# Patient Record
Sex: Male | Born: 1980 | Race: White | Hispanic: No | Marital: Married | Smoking: Current every day smoker
Health system: Southern US, Community
[De-identification: ages and names within clinical notes are randomized; demographics above are authoritative.]

## PROBLEM LIST (undated history)

## (undated) DIAGNOSIS — G4730 Sleep apnea, unspecified: Secondary | ICD-10-CM

## (undated) DIAGNOSIS — G709 Myoneural disorder, unspecified: Secondary | ICD-10-CM

## (undated) DIAGNOSIS — K219 Gastro-esophageal reflux disease without esophagitis: Secondary | ICD-10-CM

## (undated) DIAGNOSIS — M1990 Unspecified osteoarthritis, unspecified site: Secondary | ICD-10-CM

## (undated) DIAGNOSIS — F419 Anxiety disorder, unspecified: Secondary | ICD-10-CM

## (undated) DIAGNOSIS — F32A Depression, unspecified: Secondary | ICD-10-CM

## (undated) DIAGNOSIS — R51 Headache: Secondary | ICD-10-CM

## (undated) DIAGNOSIS — I10 Essential (primary) hypertension: Secondary | ICD-10-CM

---

## 2004-08-18 HISTORY — PX: KNEE ARTHROSCOPY: SUR90

## 2004-08-18 HISTORY — PX: KNEE SURGERY: SHX244

## 2004-11-08 ENCOUNTER — Ambulatory Visit (HOSPITAL_COMMUNITY): Admission: RE | Admit: 2004-11-08 | Discharge: 2004-11-08 | Payer: Self-pay | Admitting: Orthopedic Surgery

## 2004-11-22 ENCOUNTER — Ambulatory Visit (HOSPITAL_COMMUNITY): Admission: AD | Admit: 2004-11-22 | Discharge: 2004-11-23 | Payer: Self-pay | Admitting: Orthopedic Surgery

## 2011-04-14 ENCOUNTER — Ambulatory Visit (HOSPITAL_BASED_OUTPATIENT_CLINIC_OR_DEPARTMENT_OTHER)
Admission: RE | Admit: 2011-04-14 | Discharge: 2011-04-14 | Disposition: A | Discharge status: Home / Self Care | Payer: 59 | Source: Ambulatory Visit | Attending: Orthopedic Surgery | Admitting: Orthopedic Surgery

## 2011-04-14 DIAGNOSIS — Z01810 Encounter for preprocedural cardiovascular examination: Secondary | ICD-10-CM | POA: Insufficient documentation

## 2011-04-14 DIAGNOSIS — Z01812 Encounter for preprocedural laboratory examination: Secondary | ICD-10-CM | POA: Insufficient documentation

## 2011-04-14 DIAGNOSIS — X58XXXA Exposure to other specified factors, initial encounter: Secondary | ICD-10-CM | POA: Insufficient documentation

## 2011-04-14 DIAGNOSIS — Y929 Unspecified place or not applicable: Secondary | ICD-10-CM | POA: Insufficient documentation

## 2011-04-14 DIAGNOSIS — S60459A Superficial foreign body of unspecified finger, initial encounter: Secondary | ICD-10-CM | POA: Insufficient documentation

## 2011-04-14 LAB — POCT I-STAT, CHEM 8
BUN: 14 mg/dL (ref 6–23)
Calcium, Ion: 1.15 mmol/L (ref 1.12–1.32)
Chloride: 104 mEq/L (ref 96–112)
Creatinine, Ser: 0.9 mg/dL (ref 0.50–1.35)
Glucose, Bld: 100 mg/dL — ABNORMAL HIGH (ref 70–99)
HCT: 45 % (ref 39.0–52.0)
Hemoglobin: 15.3 g/dL (ref 13.0–17.0)
Potassium: 4.2 mEq/L (ref 3.5–5.1)
Sodium: 139 mEq/L (ref 135–145)
TCO2: 25 mmol/L (ref 0–100)

## 2011-04-16 LAB — WOUND CULTURE

## 2011-04-19 LAB — ANAEROBIC CULTURE

## 2011-04-30 NOTE — Op Note (Signed)
  NAMEDERMOT, GREMILLION               ACCOUNT NO.:  [id number]  MEDICAL RECORD NO.:  [id number]  LOCATION:                                 FACILITY:  PHYSICIAN:  Artist Pais. Mina Marble, M.D.   DATE OF BIRTH:  DATE OF PROCEDURE:  04/14/2011 DATE OF DISCHARGE:                              OPERATIVE REPORT   PREOPERATIVE DIAGNOSIS:  Foreign body deep left index finger volar aspect.  POSTOPERATIVE DIAGNOSIS:  Foreign body deep left index finger volar aspect.  PROCEDURE:  I and D above with excision of foreign body deep (wooden splinter).  SURGEON:  Artist Pais. Mina Marble, MD  ASSISTANT:  None.  ANESTHESIA:  General.  TOURNIQUET TIME:  8 minutes.  COMPLICATIONS:  None.  DRAIN:  None.  CULTURES:  X2 sent.  DESCRIPTION OF PROCEDURE:  The patient was taken to the operating suite after induction of adequate general anesthesia, left upper extremity was prepped and draped in sterile fashion.  An Esmarch was used to exsanguinate the limb.  Tourniquet inflated to 250 mmHg.  At this point, a transverse incision was made over the proximal phalanx to the PIP and MP flexion creases volarly along the ulnar side about a centimeter in length.  Skin was incised.  Some light material was cultured. Dissection was carried down toward the flexor sheath for a 3 mm x 9 mm wooden splinter was removed without difficulty.  Wound was copiously irrigated with 500 mL of normal saline.  Loosely closed with a single 4- 0 nylon horizontal mattress suture.  Xeroform, 4x4s and a Coban wrap was applied.  The patient tolerated the procedure well, to recovery room in stable fashion.     Artist Pais Mina Marble, M.D.    MAW/MEDQ  D:  04/14/2011  T:  04/14/2011  Job:  [id number]  Electronically Signed by Dairl Ponder M.D. on 04/30/2011 08:45:27 AM

## 2012-05-18 ENCOUNTER — Other Ambulatory Visit: Payer: Self-pay | Admitting: Neurosurgery

## 2012-05-20 ENCOUNTER — Encounter (HOSPITAL_COMMUNITY): Payer: Self-pay | Admitting: Pharmacy Technician

## 2012-05-24 NOTE — Pre-Procedure Instructions (Signed)
[address]ING  05/24/2012   Your procedure is scheduled on:  Monday, October 14th  Report to [facility] at 1045 AM.  Call this number if you have problems the morning of surgery: [phone]   Remember:   Do not eat food or drink:After Midnight.  Take these medicines the morning of surgery with A SIP OF WATER: prilosec, percocet if needed   Do not wear jewelry, make-up or nail polish.  Do not wear lotions, powders, or perfumes.   Do not shave 48 hours prior to surgery. Men may shave face and neck.  Do not bring valuables to the hospital.  Contacts, dentures or bridgework may not be worn into surgery.  Leave suitcase in the car. After surgery it may be brought to your room.  For patients admitted to the hospital, checkout time is 11:00 AM the day of discharge.    Special Instructions: Shower using CHG 2 nights before surgery and the night before surgery.  If you shower the day of surgery use CHG.  Use special wash - you have one bottle of CHG for all showers.  You should use approximately 1/3 of the bottle for each shower.   Please read over the following fact sheets that you were given: Pain Booklet, Coughing and Deep Breathing, MRSA Information and Surgical Site Infection Prevention

## 2012-05-25 ENCOUNTER — Encounter (HOSPITAL_COMMUNITY)
Admission: RE | Admit: 2012-05-25 | Discharge: 2012-05-25 | Disposition: A | Discharge status: Home / Self Care | Payer: Managed Care, Other (non HMO) | Source: Ambulatory Visit | Attending: Neurosurgery | Admitting: Neurosurgery

## 2012-05-25 ENCOUNTER — Encounter (HOSPITAL_COMMUNITY): Payer: Self-pay

## 2012-05-25 HISTORY — DX: Essential (primary) hypertension: I10

## 2012-05-25 HISTORY — DX: Headache: R51

## 2012-05-25 HISTORY — DX: Unspecified osteoarthritis, unspecified site: M19.90

## 2012-05-25 HISTORY — DX: Gastro-esophageal reflux disease without esophagitis: K21.9

## 2012-05-25 LAB — BASIC METABOLIC PANEL
BUN: 13 mg/dL (ref 6–23)
Calcium: 10 mg/dL (ref 8.4–10.5)
GFR calc Af Amer: >90 mL/min (ref >90)
GFR calc non Af Amer: >90 mL/min (ref >90)
Potassium: 4.3 mEq/L (ref 3.5–5.1)
Sodium: 139 mEq/L (ref 135–145)

## 2012-05-25 LAB — CBC
Hemoglobin: 15.4 g/dL (ref 13.0–17.0)
MCHC: 34.4 g/dL (ref 30.0–36.0)
Platelets: 197 10*3/uL (ref 150–400)
RBC: 5.14 MIL/uL (ref 4.22–5.81)

## 2012-05-25 LAB — SURGICAL PCR SCREEN: MRSA, PCR: POSITIVE — AB

## 2012-05-25 NOTE — Progress Notes (Signed)
Primary Physician - Dr. Nevada Crane; [place] No type of cardiac work-up

## 2012-05-26 NOTE — Consult Note (Signed)
Anesthesia chart review: Patient is a 31 year old male scheduled for C3-4, C4-5 ACDF on 05/31/2012 by Dr. Lovell Sheehan. History includes obesity, nonsmoker, hypertension, headaches, GERD, arthritis, prior knee surgery. PCP is Dr. Jeanie Sewer at [facility] in [place].  EKG on 05/25/12 showed NSR.    Labs noted.    CXR on 05/25/12 showed 1. No radiographic evidence of acute cardiopulmonary disease. 2. 6 mm dense nodular opacity projecting over the left apex noted only on the frontal projection. This is strongly favored to represent either a small calcified granuloma or a bone island in this young patient. If of clinical concern, this could be followed with repeat chest radiograph in 6 months to ensure stability.  Darl Pikes at Dr. York Ram office is aware of CXR results and will review with Dr. Lovell Sheehan.  Defer plans for follow-up to the surgeon.  Shonna Chock, PA-C

## 2012-05-30 MED ORDER — CEFAZOLIN SODIUM-DEXTROSE 2-3 GM-% IV SOLR
2.0000 g | INTRAVENOUS | Status: AC
  Administered 2012-05-31: 2 g via INTRAVENOUS

## 2012-05-31 ENCOUNTER — Encounter (HOSPITAL_COMMUNITY)
Admission: RE | Disposition: A | Discharge status: Home / Self Care | Payer: Self-pay | Source: Ambulatory Visit | Attending: Neurosurgery

## 2012-05-31 ENCOUNTER — Observation Stay (HOSPITAL_COMMUNITY)
Admission: RE | Admit: 2012-05-31 | Discharge: 2012-06-01 | Disposition: A | Discharge status: Home / Self Care | Payer: Managed Care, Other (non HMO) | Source: Ambulatory Visit | Attending: Neurosurgery | Admitting: Neurosurgery

## 2012-05-31 ENCOUNTER — Encounter (HOSPITAL_COMMUNITY): Payer: Self-pay | Admitting: Vascular Surgery

## 2012-05-31 ENCOUNTER — Encounter (HOSPITAL_COMMUNITY): Payer: Self-pay | Admitting: *Deleted

## 2012-05-31 ENCOUNTER — Ambulatory Visit (HOSPITAL_COMMUNITY): Payer: Managed Care, Other (non HMO)

## 2012-05-31 ENCOUNTER — Ambulatory Visit (HOSPITAL_COMMUNITY): Payer: Managed Care, Other (non HMO) | Admitting: Vascular Surgery

## 2012-05-31 DIAGNOSIS — Z01810 Encounter for preprocedural cardiovascular examination: Secondary | ICD-10-CM | POA: Insufficient documentation

## 2012-05-31 DIAGNOSIS — M5020 Other cervical disc displacement, unspecified cervical region: Secondary | ICD-10-CM

## 2012-05-31 DIAGNOSIS — Z01818 Encounter for other preprocedural examination: Secondary | ICD-10-CM | POA: Insufficient documentation

## 2012-05-31 DIAGNOSIS — M5000 Cervical disc disorder with myelopathy, unspecified cervical region: Principal | ICD-10-CM | POA: Insufficient documentation

## 2012-05-31 DIAGNOSIS — K219 Gastro-esophageal reflux disease without esophagitis: Secondary | ICD-10-CM | POA: Insufficient documentation

## 2012-05-31 DIAGNOSIS — M4712 Other spondylosis with myelopathy, cervical region: Secondary | ICD-10-CM | POA: Insufficient documentation

## 2012-05-31 DIAGNOSIS — I10 Essential (primary) hypertension: Secondary | ICD-10-CM | POA: Insufficient documentation

## 2012-05-31 DIAGNOSIS — Z01812 Encounter for preprocedural laboratory examination: Secondary | ICD-10-CM | POA: Insufficient documentation

## 2012-05-31 HISTORY — PX: ANTERIOR CERVICAL DECOMP/DISCECTOMY FUSION: SHX1161

## 2012-05-31 SURGERY — ANTERIOR CERVICAL DECOMPRESSION/DISCECTOMY FUSION 2 LEVELS
Anesthesia: General | Wound class: Clean

## 2012-05-31 MED ORDER — BUPIVACAINE-EPINEPHRINE PF 0.5-1:200000 % IJ SOLN
INTRAMUSCULAR | Status: DC | PRN
  Administered 2012-05-31: 10 mL

## 2012-05-31 MED ORDER — CEFAZOLIN SODIUM-DEXTROSE 2-3 GM-% IV SOLR
INTRAVENOUS | Status: AC
  Filled 2012-05-31: qty 100

## 2012-05-31 MED ORDER — OXYCODONE HCL 5 MG PO TABS: 5.0000 mg | ORAL_TABLET | Freq: Once | ORAL | Status: DC | PRN

## 2012-05-31 MED ORDER — PANTOPRAZOLE SODIUM 40 MG PO TBEC: 40.0000 mg | DELAYED_RELEASE_TABLET | Freq: Every day | ORAL | Status: DC

## 2012-05-31 MED ORDER — HYDROMORPHONE HCL PF 1 MG/ML IJ SOLN
INTRAMUSCULAR | Status: AC
  Administered 2012-05-31: 0.5 mg via INTRAVENOUS
  Filled 2012-05-31: qty 1

## 2012-05-31 MED ORDER — FENTANYL CITRATE 0.05 MG/ML IJ SOLN
INTRAMUSCULAR | Status: DC | PRN
  Administered 2012-05-31 (×5): 50 ug via INTRAVENOUS
  Administered 2012-05-31: 100 ug via INTRAVENOUS
  Administered 2012-05-31 (×3): 50 ug via INTRAVENOUS

## 2012-05-31 MED ORDER — MENTHOL 3 MG MT LOZG: 1.0000 | LOZENGE | OROMUCOSAL | Status: DC | PRN

## 2012-05-31 MED ORDER — OXYCODONE HCL 5 MG/5ML PO SOLN: 5.0000 mg | Freq: Once | ORAL | Status: DC | PRN

## 2012-05-31 MED ORDER — DEXAMETHASONE 4 MG PO TABS: 4.0000 mg | ORAL_TABLET | Freq: Four times a day (QID) | ORAL | Status: DC

## 2012-05-31 MED ORDER — CEFAZOLIN SODIUM-DEXTROSE 2-3 GM-% IV SOLR
2.0000 g | Freq: Three times a day (TID) | INTRAVENOUS | Status: AC
  Administered 2012-05-31 – 2012-06-01 (×2): 2 g via INTRAVENOUS
  Filled 2012-05-31 (×2): qty 50

## 2012-05-31 MED ORDER — LACTATED RINGERS IV SOLN
INTRAVENOUS | Status: DC | PRN
  Administered 2012-05-31 (×2): via INTRAVENOUS

## 2012-05-31 MED ORDER — MIDAZOLAM HCL 5 MG/5ML IJ SOLN
INTRAMUSCULAR | Status: DC | PRN
  Administered 2012-05-31: 2 mg via INTRAVENOUS

## 2012-05-31 MED ORDER — ONDANSETRON HCL 4 MG/2ML IJ SOLN: 4.0000 mg | INTRAMUSCULAR | Status: DC | PRN

## 2012-05-31 MED ORDER — HYDROCODONE-ACETAMINOPHEN 5-325 MG PO TABS: 1.0000 | ORAL_TABLET | ORAL | Status: DC | PRN

## 2012-05-31 MED ORDER — MORPHINE SULFATE 2 MG/ML IJ SOLN
1.0000 mg | INTRAMUSCULAR | Status: DC | PRN
  Administered 2012-05-31 – 2012-06-01 (×2): 4 mg via INTRAVENOUS
  Filled 2012-05-31 (×2): qty 2

## 2012-05-31 MED ORDER — PHENOL 1.4 % MT LIQD: 1.0000 | OROMUCOSAL | Status: DC | PRN

## 2012-05-31 MED ORDER — ONDANSETRON HCL 4 MG/2ML IJ SOLN
INTRAMUSCULAR | Status: DC | PRN
  Administered 2012-05-31: 4 mg via INTRAVENOUS

## 2012-05-31 MED ORDER — OXYCODONE-ACETAMINOPHEN 5-325 MG PO TABS
1.0000 | ORAL_TABLET | ORAL | Status: DC | PRN
  Administered 2012-05-31 – 2012-06-01 (×2): 2 via ORAL
  Filled 2012-05-31 (×2): qty 2

## 2012-05-31 MED ORDER — ROCURONIUM BROMIDE 100 MG/10ML IV SOLN
INTRAVENOUS | Status: DC | PRN
  Administered 2012-05-31 (×3): 10 mg via INTRAVENOUS
  Administered 2012-05-31: 50 mg via INTRAVENOUS

## 2012-05-31 MED ORDER — DIAZEPAM 5 MG PO TABS
5.0000 mg | ORAL_TABLET | Freq: Four times a day (QID) | ORAL | Status: DC | PRN
  Administered 2012-05-31 – 2012-06-01 (×2): 5 mg via ORAL
  Filled 2012-05-31 (×2): qty 1

## 2012-05-31 MED ORDER — ZOLPIDEM TARTRATE 5 MG PO TABS: 5.0000 mg | ORAL_TABLET | Freq: Every evening | ORAL | Status: DC | PRN

## 2012-05-31 MED ORDER — NEOSTIGMINE METHYLSULFATE 1 MG/ML IJ SOLN
INTRAMUSCULAR | Status: DC | PRN
  Administered 2012-05-31: 4 mg via INTRAVENOUS

## 2012-05-31 MED ORDER — AMLODIPINE BESY-BENAZEPRIL HCL 10-20 MG PO CAPS: 1.0000 | ORAL_CAPSULE | Freq: Every day | ORAL | Status: DC

## 2012-05-31 MED ORDER — OXYCODONE-ACETAMINOPHEN 5-325 MG PO TABS
ORAL_TABLET | ORAL | Status: AC
  Filled 2012-05-31: qty 2

## 2012-05-31 MED ORDER — BENAZEPRIL HCL 20 MG PO TABS
20.0000 mg | ORAL_TABLET | Freq: Every day | ORAL | Status: DC
  Administered 2012-05-31: 20 mg via ORAL
  Filled 2012-05-31 (×2): qty 1

## 2012-05-31 MED ORDER — MEPERIDINE HCL 25 MG/ML IJ SOLN: 6.2500 mg | INTRAMUSCULAR | Status: DC | PRN

## 2012-05-31 MED ORDER — ACETAMINOPHEN 325 MG PO TABS: 650.0000 mg | ORAL_TABLET | ORAL | Status: DC | PRN

## 2012-05-31 MED ORDER — PROPOFOL 10 MG/ML IV BOLUS
INTRAVENOUS | Status: DC | PRN
  Administered 2012-05-31: 200 mg via INTRAVENOUS

## 2012-05-31 MED ORDER — HEMOSTATIC AGENTS (NO CHARGE) OPTIME
TOPICAL | Status: DC | PRN
  Administered 2012-05-31: 1 via TOPICAL

## 2012-05-31 MED ORDER — ONDANSETRON HCL 4 MG/2ML IJ SOLN: 4.0000 mg | Freq: Once | INTRAMUSCULAR | Status: DC | PRN

## 2012-05-31 MED ORDER — DEXAMETHASONE SODIUM PHOSPHATE 4 MG/ML IJ SOLN
4.0000 mg | Freq: Four times a day (QID) | INTRAMUSCULAR | Status: DC
  Administered 2012-05-31 – 2012-06-01 (×2): 4 mg via INTRAVENOUS
  Filled 2012-05-31 (×2): qty 1

## 2012-05-31 MED ORDER — AMLODIPINE BESYLATE 10 MG PO TABS
10.0000 mg | ORAL_TABLET | Freq: Every day | ORAL | Status: DC
  Administered 2012-05-31: 10 mg via ORAL
  Filled 2012-05-31 (×2): qty 1

## 2012-05-31 MED ORDER — BACITRACIN 50000 UNITS IM SOLR
INTRAMUSCULAR | Status: AC
  Filled 2012-05-31: qty 1

## 2012-05-31 MED ORDER — SODIUM CHLORIDE 0.9 % IR SOLN
Status: DC | PRN
  Administered 2012-05-31: 15:00:00

## 2012-05-31 MED ORDER — EPHEDRINE SULFATE 50 MG/ML IJ SOLN
INTRAMUSCULAR | Status: DC | PRN
  Administered 2012-05-31: 10 mg via INTRAVENOUS

## 2012-05-31 MED ORDER — HYDROMORPHONE HCL PF 1 MG/ML IJ SOLN
0.2500 mg | INTRAMUSCULAR | Status: DC | PRN
  Administered 2012-05-31 (×3): 0.5 mg via INTRAVENOUS

## 2012-05-31 MED ORDER — GLYCOPYRROLATE 0.2 MG/ML IJ SOLN
INTRAMUSCULAR | Status: DC | PRN
  Administered 2012-05-31: .6 mg via INTRAVENOUS

## 2012-05-31 MED ORDER — DOCUSATE SODIUM 100 MG PO CAPS
100.0000 mg | ORAL_CAPSULE | Freq: Two times a day (BID) | ORAL | Status: DC
  Administered 2012-05-31: 100 mg via ORAL
  Filled 2012-05-31: qty 1

## 2012-05-31 MED ORDER — LIDOCAINE HCL (CARDIAC) 20 MG/ML IV SOLN
INTRAVENOUS | Status: DC | PRN
  Administered 2012-05-31: 100 mg via INTRAVENOUS

## 2012-05-31 MED ORDER — SODIUM CHLORIDE 0.9 % IV SOLN
INTRAVENOUS | Status: AC
  Filled 2012-05-31: qty 500

## 2012-05-31 MED ORDER — ARTIFICIAL TEARS OP OINT
TOPICAL_OINTMENT | OPHTHALMIC | Status: DC | PRN
  Administered 2012-05-31: 1 via OPHTHALMIC

## 2012-05-31 MED ORDER — 0.9 % SODIUM CHLORIDE (POUR BTL) OPTIME
TOPICAL | Status: DC | PRN
  Administered 2012-05-31: 1000 mL

## 2012-05-31 MED ORDER — THROMBIN 5000 UNITS EX SOLR
CUTANEOUS | Status: DC | PRN
  Administered 2012-05-31 (×2): 5000 [IU] via TOPICAL

## 2012-05-31 MED ORDER — BACITRACIN ZINC 500 UNIT/GM EX OINT
TOPICAL_OINTMENT | CUTANEOUS | Status: DC | PRN
  Administered 2012-05-31: 1 via TOPICAL

## 2012-05-31 MED ORDER — LACTATED RINGERS IV SOLN
INTRAVENOUS | Status: DC
  Administered 2012-05-31: 23:00:00 via INTRAVENOUS

## 2012-05-31 MED ORDER — ACETAMINOPHEN 650 MG RE SUPP: 650.0000 mg | RECTAL | Status: DC | PRN

## 2012-05-31 SURGICAL SUPPLY — 59 items
APL SKNCLS STERI-STRIP NONHPOA (GAUZE/BANDAGES/DRESSINGS) ×1
BAG DECANTER FOR FLEXI CONT (MISCELLANEOUS) ×2 IMPLANT
BENZOIN TINCTURE PRP APPL 2/3 (GAUZE/BANDAGES/DRESSINGS) ×3 IMPLANT
BIT DRILL SPINE QC 14 (BIT) ×1 IMPLANT
BLADE SURG 15 STRL LF DISP TIS (BLADE) ×1 IMPLANT
BLADE SURG 15 STRL SS (BLADE) ×2
BLADE ULTRA TIP 2M (BLADE) ×2 IMPLANT
BRUSH SCRUB EZ PLAIN DRY (MISCELLANEOUS) ×2 IMPLANT
BUR BARREL STRAIGHT FLUTE 4.0 (BURR) ×2 IMPLANT
BUR MATCHSTICK NEURO 3.0 LAGG (BURR) ×2 IMPLANT
CANISTER SUCTION 2500CC (MISCELLANEOUS) ×2 IMPLANT
CLOTH BEACON ORANGE TIMEOUT ST (SAFETY) ×2 IMPLANT
CONT SPEC 4OZ CLIKSEAL STRL BL (MISCELLANEOUS) ×2 IMPLANT
COVER MAYO STAND STRL (DRAPES) ×2 IMPLANT
DRAPE LAPAROTOMY 100X72 PEDS (DRAPES) ×2 IMPLANT
DRAPE MICROSCOPE LEICA (MISCELLANEOUS) IMPLANT
DRAPE POUCH INSTRU U-SHP 10X18 (DRAPES) ×2 IMPLANT
DRAPE SURG 17X23 STRL (DRAPES) ×4 IMPLANT
ELECT REM PT RETURN 9FT ADLT (ELECTROSURGICAL) ×2
ELECTRODE REM PT RTRN 9FT ADLT (ELECTROSURGICAL) ×1 IMPLANT
GAUZE SPONGE 4X4 16PLY XRAY LF (GAUZE/BANDAGES/DRESSINGS) ×1 IMPLANT
GLOVE BIO SURGEON STRL SZ8.5 (GLOVE) ×2 IMPLANT
GLOVE ECLIPSE 7.5 STRL STRAW (GLOVE) ×3 IMPLANT
GLOVE ECLIPSE 8.5 STRL (GLOVE) ×1 IMPLANT
GLOVE EXAM NITRILE LRG STRL (GLOVE) IMPLANT
GLOVE EXAM NITRILE MD LF STRL (GLOVE) ×1 IMPLANT
GLOVE EXAM NITRILE XL STR (GLOVE) IMPLANT
GLOVE EXAM NITRILE XS STR PU (GLOVE) IMPLANT
GLOVE INDICATOR 8.0 STRL GRN (GLOVE) ×1 IMPLANT
GLOVE SS BIOGEL STRL SZ 8 (GLOVE) ×1 IMPLANT
GLOVE SUPERSENSE BIOGEL SZ 8 (GLOVE) ×1
GOWN BRE IMP SLV AUR LG STRL (GOWN DISPOSABLE) IMPLANT
GOWN BRE IMP SLV AUR XL STRL (GOWN DISPOSABLE) ×2 IMPLANT
KIT BASIN OR (CUSTOM PROCEDURE TRAY) ×2 IMPLANT
KIT ROOM TURNOVER OR (KITS) ×2 IMPLANT
MARKER SKIN DUAL TIP RULER LAB (MISCELLANEOUS) ×2 IMPLANT
NDL SPNL 18GX3.5 QUINCKE PK (NEEDLE) ×1 IMPLANT
NEEDLE HYPO 22GX1.5 SAFETY (NEEDLE) ×2 IMPLANT
NEEDLE SPNL 18GX3.5 QUINCKE PK (NEEDLE) ×2 IMPLANT
NS IRRIG 1000ML POUR BTL (IV SOLUTION) ×2 IMPLANT
PACK LAMINECTOMY NEURO (CUSTOM PROCEDURE TRAY) ×2 IMPLANT
PEEK VISTA 14X14X7MM (Peek) ×2 IMPLANT
PIN DISTRACTION 14MM (PIN) ×4 IMPLANT
PLATE ANT CERV XTEND 2 LV (Plate) ×1 IMPLANT
PUTTY 5ML ACTIFUSE ABX (Putty) ×1 IMPLANT
PUTTY ABX ACTIFUSE 1.5ML (Putty) ×1 IMPLANT
RUBBERBAND STERILE (MISCELLANEOUS) IMPLANT
SCREW XTD VAR 4.2 SELF TAP (Screw) ×6 IMPLANT
SPONGE GAUZE 4X4 12PLY (GAUZE/BANDAGES/DRESSINGS) ×2 IMPLANT
SPONGE INTESTINAL PEANUT (DISPOSABLE) ×4 IMPLANT
SPONGE SURGIFOAM ABS GEL SZ50 (HEMOSTASIS) ×2 IMPLANT
STRIP CLOSURE SKIN 1/2X4 (GAUZE/BANDAGES/DRESSINGS) ×2 IMPLANT
SUT VIC AB 0 CT1 27 (SUTURE) ×2
SUT VIC AB 0 CT1 27XBRD ANTBC (SUTURE) ×1 IMPLANT
SUT VIC AB 3-0 SH 8-18 (SUTURE) ×2 IMPLANT
SYR 20ML ECCENTRIC (SYRINGE) ×2 IMPLANT
TOWEL OR 17X24 6PK STRL BLUE (TOWEL DISPOSABLE) ×2 IMPLANT
TOWEL OR 17X26 10 PK STRL BLUE (TOWEL DISPOSABLE) ×2 IMPLANT
WATER STERILE IRR 1000ML POUR (IV SOLUTION) ×2 IMPLANT

## 2012-05-31 NOTE — Progress Notes (Signed)
Patient ID: Edward Perez, male   DOB: [birth year], 31 y.o.   MRN: [id number] Subjective:  The patient is alert and pleasant. He looks well.  Objective: Vital signs in last 24 hours: Temp:  [97.6 F (36.4 C)-98.1 F (36.7 C)] 98.1 F (36.7 C) (10/14 1655) Pulse Rate:  [61-81] 65  (10/14 1715) Resp:  [16-18] 16  (10/14 1715) BP: (119-148)/(70-87) 121/70 mmHg (10/14 1700) SpO2:  [0 %-100 %] 98 % (10/14 1715)  Intake/Output from previous day:   Intake/Output this shift: Total I/O In: 1500 [I.V.:1500] Out: 100 [Blood:100]  Physical exam the patient is alert and pleasant. He is moving all 4 extremities well. His dressing is clean and dry. There is no evidence of hematoma or shift.  Lab Results: No results found for this basename: WBC:2,HGB:2,HCT:2,PLT:2 in the last 72 hours BMET No results found for this basename: NA:2,K:2,CL:2,CO2:2,GLUCOSE:2,BUN:2,CREATININE:2,CALCIUM:2 in the last 72 hours  Studies/Results: Dg Cervical Spine 2-3 Views  05/31/2012  *RADIOLOGY REPORT*  Clinical Data: Intraoperative localization for cervical spine surgery.  CERVICAL SPINE - 2-3 VIEW  Comparison: MRI cervical spine 90 and 25,013.  Findings: Lateral cervical spine film labeled #1 demonstrates a spinal needle in the C3-4 disc space.  Film labeled #2 demonstrates anterior plate and screw and interbody bone plug fusion at C3-4 and C4-5.  IMPRESSION: C3-4 and C4-5 fusion with well-positioned hardware and no complicating features.   Original Report Authenticated By: P. Loralie Champagne, M.D.     Assessment/Plan: The patient is doing well.  LOS: 0 days     Edward Perez D 05/31/2012, 5:19 PM

## 2012-05-31 NOTE — Anesthesia Procedure Notes (Signed)
Procedure Name: Intubation Date/Time: 05/31/2012 2:06 PM Performed by: Fransisca Kaufmann Pre-anesthesia Checklist: Patient identified, Timeout performed, Emergency Drugs available, Suction available and Patient being monitored Patient Re-evaluated:Patient Re-evaluated prior to inductionOxygen Delivery Method: Circle system utilized Preoxygenation: Pre-oxygenation with 100% oxygen Intubation Type: IV induction Grade View: Grade II Tube type: Oral Tube size: 7.5 mm Airway Equipment and Method: Video-laryngoscopy and Stylet Placement Confirmation: ETT inserted through vocal cords under direct vision,  breath sounds checked- equal and bilateral,  positive ETCO2 and CO2 detector Secured at: 23 cm Tube secured with: Tape Dental Injury: Teeth and Oropharynx as per pre-operative assessment

## 2012-05-31 NOTE — Anesthesia Preprocedure Evaluation (Addendum)
Anesthesia Evaluation  Patient identified by MRN, date of birth, ID band Patient awake    Reviewed: Allergy & Precautions, H&P , NPO status , Patient's Chart, lab work & pertinent test results  Airway Mallampati: I TM Distance: >3 FB Neck ROM: Full    Dental   Pulmonary          Cardiovascular hypertension, Pt. on medications     Neuro/Psych  Headaches,    GI/Hepatic GERD-  Controlled,  Endo/Other    Renal/GU      Musculoskeletal   Abdominal   Peds  Hematology   Anesthesia Other Findings   Reproductive/Obstetrics                          Anesthesia Physical Anesthesia Plan  ASA: II  Anesthesia Plan: General   Post-op Pain Management:    Induction: Intravenous  Airway Management Planned: Video Laryngoscope Planned and Oral ETT  Additional Equipment:   Intra-op Plan:   Post-operative Plan: Extubation in OR  Informed Consent: I have reviewed the patients History and Physical, chart, labs and discussed the procedure including the risks, benefits and alternatives for the proposed anesthesia with the patient or authorized representative who has indicated his/her understanding and acceptance.     Plan Discussed with: CRNA and Surgeon  Anesthesia Plan Comments:         Anesthesia Quick Evaluation

## 2012-05-31 NOTE — Op Note (Signed)
Brief history: The patient is a 31 year old white male who has complained of neck and arm pain consistent with a cervical radiculopathy. He has failed medical management and was worked up with a cervical MRI. This demonstrated a large herniated disc at C3-4 and C4-5. I discussed the various treatment option with the patient including surgery. The patient has weighed the risks, benefits, and alternatives surgery and decided proceed with a C3-4 and C4-5 intracervical dissection, fusion, and plating.  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 Delma Officer  Asst.: Dr. Mardelle Matte pool  Anesthesia: Gen. endotracheal  Estimated blood loss: 125 cc  Drains: None  Complications: None  Description of procedure: The patient was brought to the operating room by the anesthesia team. General endotracheal anesthesia was induced. A roll was placed under the patient's shoulders to keep the neck in the neutral position. The patient's anterior cervical region was then prepared with Betadine scrub and Betadine solution. Sterile drapes were applied.  The area to be incised was then injected with Marcaine with epinephrine solution. I then used a scalpel to make a transverse incision in the patient's left anterior neck. I used the Metzenbaum scissors to divide the platysmal muscle and then to dissect medial to the sternocleidomastoid muscle, jugular vein, and carotid artery. I carefully dissected down towards the anterior cervical spine identifying the esophagus and  retracting it medially. Then using Kitner swabs to clear soft tissue from the anterior cervical spine. We then inserted a bent spinal needle into the upper exposed intervertebral disc space. We then obtained intraoperative radiographs confirm our location.  I then used electrocautery to detach the medial border of the longus colli muscle bilaterally from the C3-4 and C4-5 intervertebral disc spaces. I then inserted the Caspar self-retaining retractor underneath the longus colli muscle bilaterally to provide exposure.  We then incised the intervertebral disc at C3-4. We then performed a partial intervertebral discectomy with a pituitary forceps and the Karlin curettes. I then inserted distraction screws into the vertebral bodies at C3 and C4. We then distracted the interspace. We then used the high-speed drill to decorticate the vertebral endplates at C3-4, to drill away the remainder of the intervertebral disc, to drill away some posterior spondylosis, and to thin out the posterior longitudinal ligament. I then incised ligament with the arachnoid knife. We then removed the ligament with a Kerrison punches undercutting the vertebral endplates and decompressing the thecal sac. We encountered a large central herniated disc as expected. We removed it with the pituitary forceps and the Kerrison punches. We then performed foraminotomies about the bilateral C4 nerve roots. This completed the decompression at this level.  We then repeated this procedure in an analogous fashion at C4-5, decompressing the thecal sac and the bilateral C5 nerve roots.  We now turned our to attention to the interbody fusion. We used the trial spacers to determine the appropriate size for the interbody prosthesis. We then pre-filled prosthesis with a combination of local morcellized autograft bone that we obtained during decompression as well as Actifuse bone graft extender. We then inserted the prosthesis into the distracted interspace  at C3-4 and C4-5. We then removed the distraction screws.  There was a good snug fit of the prosthesis in the interspace.   Having completed the fusion we now turned attention to the anterior spinal instrumentation. We used the high-speed drill to drill away some anterior spondylosis at the disc spaces so that the plate lay down flat. We selected the appropriate length titanium anterior cervical plate. We laid it along the anterior aspect of the vertebral bodies from C3-C5. We then drilled 14 mm holes at C3, C4 and C5. We then secured the plate to the vertebral bodies by placing two 14 mm self-tapping screws at C3, C4 and C5. We then obtained intraoperative radiograph. The demonstrating good position of the instrumentation. We therefore secured the screws the plate the locking each cam. This completed the instrumentation.  We then obtained hemostasis using bipolar electrocautery. We irrigated the wound out with bacitracin solution. We then removed the retractor. We inspected the esophagus for any damage. There was none apparent. We then reapproximated patient's platysmal muscle with interrupted 3-0 Vicryl suture. We then reapproximated the subcutaneous tissue with interrupted 3-0 Vicryl suture. The skin was reapproximated with Steri-Strips and benzoin. The wound was then covered with bacitracin ointment. A sterile dressing was applied. The drapes were removed. Patient was subsequently extubated by the anesthesia team and transported to the post anesthesia care unit in stable condition. All sponge instrument and needle counts were reportedly correct at the end of this case.

## 2012-05-31 NOTE — Anesthesia Postprocedure Evaluation (Signed)
Anesthesia Post Note  Patient: Edward Perez  Procedure(s) Performed: Procedure(s) (LRB): ANTERIOR CERVICAL DECOMPRESSION/DISCECTOMY FUSION 2 LEVELS (N/A)  Anesthesia type: general  Patient location: PACU  Post pain: Pain level controlled  Post assessment: Patient's Cardiovascular Status Stable  Last Vitals:  Filed Vitals:   05/31/12 1715  BP:   Pulse: 65  Temp:   Resp: 16    Post vital signs: Reviewed and stable  Level of consciousness: sedated  Complications: No apparent anesthesia complications

## 2012-05-31 NOTE — Preoperative (Signed)
Beta Blockers   Reason not to administer Beta Blockers:Not Applicable 

## 2012-05-31 NOTE — Plan of Care (Signed)
Problem: Consults Goal: Diagnosis - Spinal Surgery Outcome: Completed/Met Date Met:  05/31/12 Cervical Spine Fusion     

## 2012-05-31 NOTE — Transfer of Care (Signed)
Immediate Anesthesia Transfer of Care Note  Patient: Edward Perez  Procedure(s) Performed: Procedure(s) (LRB) with comments: ANTERIOR CERVICAL DECOMPRESSION/DISCECTOMY FUSION 2 LEVELS (N/A) - Cervical three-four Cervical four-five anterior cervical decompression with fusion interbody prothesis plating and bonegraft  Patient Location: PACU  Anesthesia Type: General  Level of Consciousness: awake, alert  and oriented  Airway & Oxygen Therapy: Patient Spontanous Breathing and Patient connected to face mask oxygen  Post-op Assessment: Report given to PACU RN  Post vital signs: Reviewed and stable  Complications: No apparent anesthesia complications

## 2012-05-31 NOTE — H&P (Signed)
Subjective: The patient is a 31 year old white male who has suffered from severe neck and on pain consistent with a cervical radiculopathy. He has failed medical management and was worked up with a cervical MRI. This demonstrated large herniated disc and spondylosis at C3-4 and C4-5. I discussed the situation with the patient and his father. The patient has weighed the risks, benefits, and alternatives surgery and decided proceed with a C3-4 and C4-5 intracervical discectomy, fusion, and plating.   Past Medical History  Diagnosis Date  . Hypertension     takes meds daily  . GERD (gastroesophageal reflux disease)   . Headache     migraines   . Arthritis     Past Surgical History  Procedure Date  . Knee surgery 2006    right    No Known Allergies  History  Substance Use Topics  . Smoking status: Never Smoker   . Smokeless tobacco: Not on file  . Alcohol Use: No    History reviewed. No pertinent family history. Prior to Admission medications   Medication Sig Start Date End Date Taking? Authorizing Provider  amLODipine-benazepril (LOTREL) 10-20 MG per capsule Take 1 capsule by mouth daily.   Yes Historical Provider, MD  oxyCODONE-acetaminophen (PERCOCET/ROXICET) 5-325 MG per tablet Take 1 tablet by mouth every 4 (four) hours as needed. For pain   Yes Historical Provider, MD  omeprazole (PRILOSEC) 20 MG capsule Take 20 mg by mouth daily.    Historical Provider, MD     Review of Systems  Positive ROS: As above  All other systems have been reviewed and were otherwise negative with the exception of those mentioned in the HPI and as above.  Objective: Vital signs in last 24 hours: Temp:  [97.6 F (36.4 C)] 97.6 F (36.4 C) (10/14 1110) Pulse Rate:  [81] 81  (10/14 1110) Resp:  [18] 18  (10/14 1110) BP: (148)/(87) 148/87 mmHg (10/14 1110) SpO2:  [0 %] 0 % (10/14 1110)  General Appearance: Alert, cooperative, no distress, appears stated age Head: Normocephalic, without obvious  abnormality, atraumatic Eyes: PERRL, conjunctiva/corneas clear, EOM's intact, fundi benign, both eyes      Ears: Normal TM's and external ear canals, both ears Throat: Lips, mucosa, and tongue normal; teeth and gums normal Neck: Supple, symmetrical, trachea midline, no adenopathy; thyroid: No enlargement/tenderness/nodules; no carotid bruit or JVD Back: Symmetric, no curvature, ROM normal, no CVA tenderness Lungs: Clear to auscultation bilaterally, respirations unlabored Heart: Regular rate and rhythm, S1 and S2 normal, no murmur, rub or gallop Abdomen: Soft, non-tender, bowel sounds active all four quadrants, no masses, no organomegaly Extremities: Extremities normal, atraumatic, no cyanosis or edema Pulses: 2+ and symmetric all extremities Skin: Skin color, texture, turgor normal, no rashes or lesions  NEUROLOGIC:   Mental status: alert and oriented, no aphasia, good attention span, Fund of knowledge/ memory ok Motor Exam - grossly normal Sensory Exam - grossly normal Reflexes:  Coordination - grossly normal Gait - grossly normal Balance - grossly normal Cranial Nerves: I: smell Not tested  II: visual acuity  OS: Normal    OD: Normal   II: visual fields Full to confrontation  II: pupils Equal, round, reactive to light  III,VII: ptosis None  III,IV,VI: extraocular muscles  Full ROM  V: mastication Normal  V: facial light touch sensation  Normal  V,VII: corneal reflex  Present  VII: facial muscle function - upper  Normal  VII: facial muscle function - lower Normal  VIII: hearing Not tested  IX:  soft palate elevation  Normal  IX,X: gag reflex Present  XI: trapezius strength  5/5  XI: sternocleidomastoid strength 5/5  XI: neck flexion strength  5/5  XII: tongue strength  Normal    Data Review Lab Results  Component Value Date   WBC 7.8 05/25/2012   HGB 15.4 05/25/2012   HCT 44.8 05/25/2012   MCV 87.2 05/25/2012   PLT 197 05/25/2012   Lab Results  Component Value Date    NA 139 05/25/2012   K 4.3 05/25/2012   CL 103 05/25/2012   CO2 27 05/25/2012   BUN 13 05/25/2012   CREATININE 0.88 05/25/2012   GLUCOSE 50* 05/25/2012   No results found for this basename: INR, PROTIME    Assessment/Plan: C3-4 and C4-5 disc herniation, spondylosis, stenosis, cervical radiculopathy/myelopathy, cervicalgia: I have discussed situation with the patient and his father. I reviewed the MR scan with them and pointed out the abnormalities. We have discussed the various treatment options with surgery. I have described the surgical option of a C3-4 and C4-5 intracervical second, fusion, and plating. I have described the surgery to them. I have shown him surgical models. We have discussed the risks, benefits, alternatives, and likelihood of achieving our goals with surgery. I have answered all the patient and his father's questions. The patient has decided to proceed with surgery.   Ronnae Kaser D 05/31/2012 1:16 PM

## 2012-06-01 MED ORDER — DSS 100 MG PO CAPS: 100.0000 mg | ORAL_CAPSULE | Freq: Two times a day (BID) | ORAL | Status: DC

## 2012-06-01 MED ORDER — CHLORHEXIDINE GLUCONATE CLOTH 2 % EX PADS
6.0000 | MEDICATED_PAD | Freq: Every day | CUTANEOUS | Status: DC
  Administered 2012-06-01: 6 via TOPICAL

## 2012-06-01 MED ORDER — OXYCODONE-ACETAMINOPHEN 10-325 MG PO TABS: 1.0000 | ORAL_TABLET | ORAL | Status: DC | PRN

## 2012-06-01 MED ORDER — DIAZEPAM 5 MG PO TABS: 5.0000 mg | ORAL_TABLET | Freq: Four times a day (QID) | ORAL | Status: DC | PRN

## 2012-06-01 NOTE — Discharge Summary (Signed)
  Physician Discharge Summary  Patient ID: Edward Perez MRN: [id number] DOB/AGE: 26-Jul-1981 31 y.o.  Admit date: 05/31/2012 Discharge date: 06/01/2012  Admission Diagnoses: C3-4 and C4-5 herniated disc, cervicalgia, cervical discopathy, cervical myelopathy  Discharge Diagnoses: The same Principal Problem:  *Cervical herniated disc   Discharged Condition: good  Hospital Course: I admitted the patient to [facility] [place] on 05/31/2012. On that day I performed a C3-4 and C4-5 anterior cervicectomy, fusion and plating. The surgery went well.  The patient's postop course was unremarkable. On postop day #1 the patient was requesting discharge to home and was discharged. He was given oral and written discharge instructions. All his questions were answered.  Consults: None Significant Diagnostic Studies: None Treatments: C3-4 and C4-5 intracervical second, fusion, and plating. Discharge Exam: Blood pressure 142/89, pulse 86, temperature 98.4 F (36.9 C), temperature source Oral, resp. rate 18, SpO2 95.00%. The patient is alert and pleasant. He looks well. His dressing is clean and dry. There is no evidence of hematoma or shift. His strength is normal his bilateral deltoids and lower extremities.  Disposition: Home  Discharge Orders    Future Orders Please Complete By Expires   Diet - low sodium heart healthy      Increase activity slowly      Discharge instructions      Comments:   Call [phone] for a followup appointment.   Remove dressing in 48 hours      Call MD for:  temperature >100.4      Call MD for:  persistant nausea and vomiting      Call MD for:  severe uncontrolled pain      Call MD for:  redness, tenderness, or signs of infection (pain, swelling, redness, odor or green/yellow discharge around incision site)      Call MD for:  difficulty breathing, headache or visual disturbances      Call MD for:  hives      Call MD for:  persistant dizziness or  light-headedness      Call MD for:  extreme fatigue          Medication List     As of 06/01/2012  7:32 AM    STOP taking these medications         oxyCODONE-acetaminophen 5-325 MG per tablet   Commonly known as: PERCOCET/ROXICET      TAKE these medications         amLODipine-benazepril 10-20 MG per capsule   Commonly known as: LOTREL   Take 1 capsule by mouth daily.      diazepam 5 MG tablet   Commonly known as: VALIUM   Take 1 tablet (5 mg total) by mouth every 6 (six) hours as needed.      DSS 100 MG Caps   Take 100 mg by mouth 2 (two) times daily.      omeprazole 20 MG capsule   Commonly known as: PRILOSEC   Take 20 mg by mouth daily.      oxyCODONE-acetaminophen 10-325 MG per tablet   Commonly known as: PERCOCET   Take 1 tablet by mouth every 4 (four) hours as needed for pain.         SignedTressie Stalker D 06/01/2012, 7:32 AM

## 2012-06-01 NOTE — Progress Notes (Signed)
Pt given D/C instructions with Rx's. Pt verbalized understanding. Pt D/C'd home via wheelchair with family per MD order. Rema Fendt, RN

## 2012-06-01 NOTE — Progress Notes (Signed)
Utilization review completed. Valta Dillon, RN, BSN. 

## 2012-06-02 ENCOUNTER — Encounter (HOSPITAL_COMMUNITY): Payer: Self-pay | Admitting: Neurosurgery

## 2013-11-28 IMAGING — CR DG CHEST 2V
2 series · 2 of 2 positions shown · non-contrast
Comparison: Chest x-ray 08/04/2008.

CLINICAL DATA: Anterior cervical discectomy and fusion.

CHEST - 2 VIEW

[view not recorded (1 of 2)]
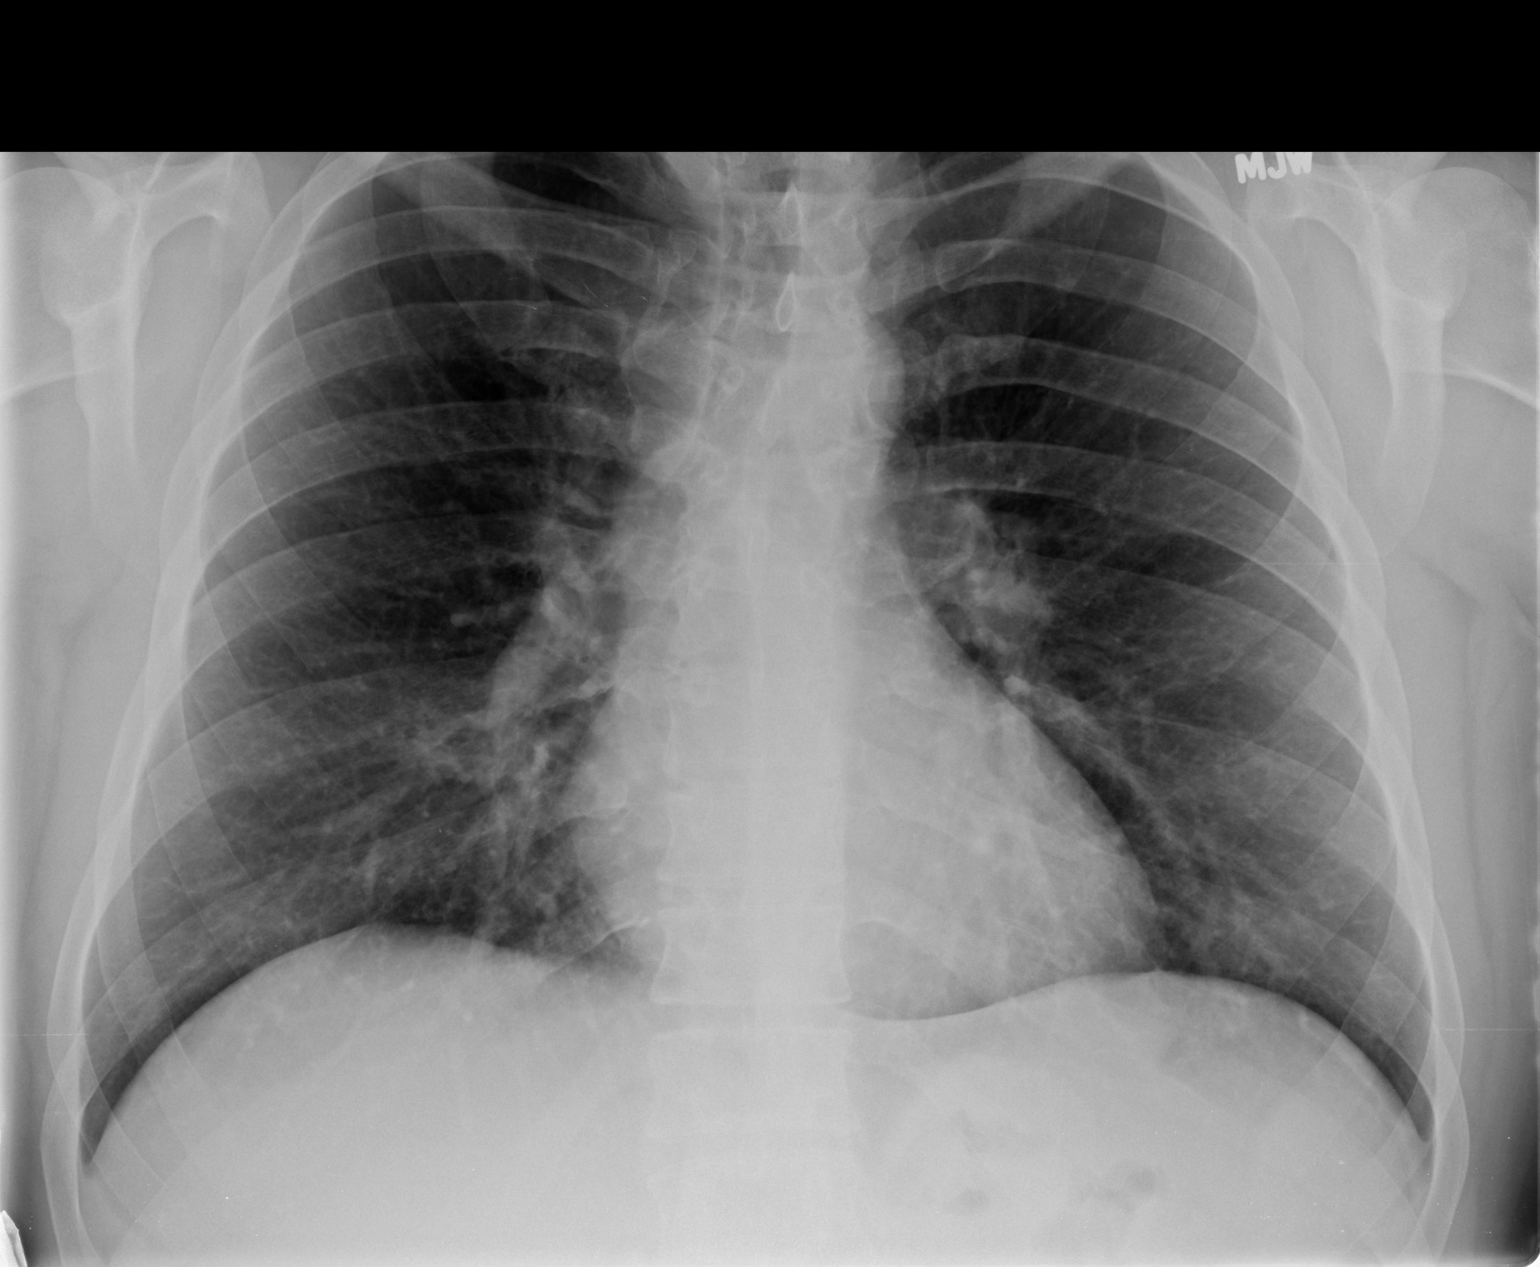

[view not recorded (2 of 2)]
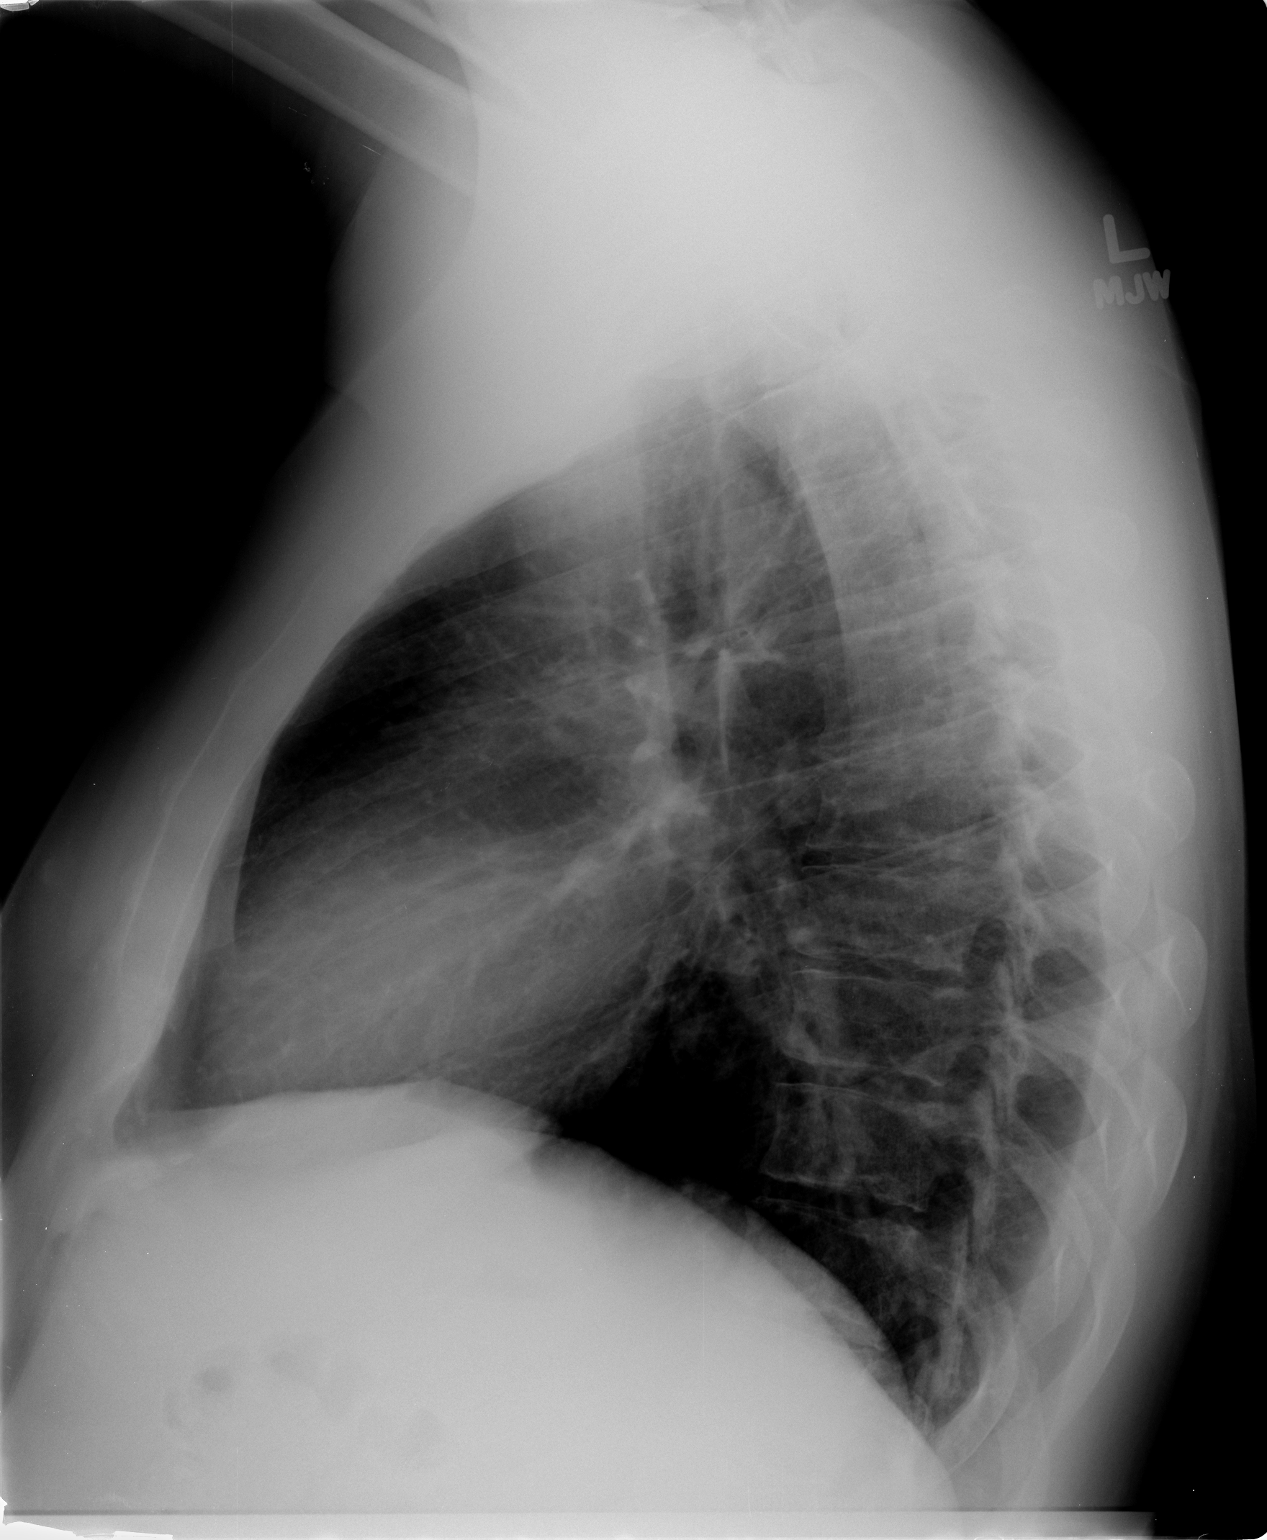

[2 of 2 positions shown; findings below may reference images not displayed]

FINDINGS: Lung volumes are normal.  No consolidative airspace
disease.  No pleural effusions.  No pneumothorax.  Projecting over
the apex of the left lung on the frontal projection only there is a
6 mm dense nodule which projects over the anterior aspect of the
left first rib and posterior aspect of the left fourth rib.  No
other larger more suspicious appearing pulmonary nodules or masses
are identified.  Pulmonary vasculature is normal.
Cardiomediastinal silhouette is within normal limits.
IMPRESSION: 1.  No radiographic evidence of acute cardiopulmonary disease.
2.  6 mm dense nodular opacity projecting over the left apex noted
only on the frontal projection.  This is strongly favored to
represent either a small calcified granuloma or a bone island in
this young patient.  If of clinical concern, this could be followed
with repeat chest radiograph in 6 months to ensure stability.

## 2017-03-12 ENCOUNTER — Encounter (HOSPITAL_COMMUNITY): Payer: Self-pay

## 2017-03-12 ENCOUNTER — Emergency Department (HOSPITAL_COMMUNITY)
Admission: EM | Admit: 2017-03-12 | Discharge: 2017-03-12 | Disposition: A | Discharge status: Home / Self Care | Payer: No Typology Code available for payment source | Attending: Emergency Medicine | Admitting: Emergency Medicine

## 2017-03-12 ENCOUNTER — Emergency Department (HOSPITAL_COMMUNITY): Payer: No Typology Code available for payment source

## 2017-03-12 DIAGNOSIS — F419 Anxiety disorder, unspecified: Secondary | ICD-10-CM

## 2017-03-12 DIAGNOSIS — R079 Chest pain, unspecified: Secondary | ICD-10-CM | POA: Diagnosis present

## 2017-03-12 DIAGNOSIS — I10 Essential (primary) hypertension: Secondary | ICD-10-CM | POA: Insufficient documentation

## 2017-03-12 DIAGNOSIS — Z79899 Other long term (current) drug therapy: Secondary | ICD-10-CM | POA: Diagnosis not present

## 2017-03-12 DIAGNOSIS — F17210 Nicotine dependence, cigarettes, uncomplicated: Secondary | ICD-10-CM | POA: Diagnosis not present

## 2017-03-12 LAB — CBC
HCT: 45.1 % (ref 39.0–52.0)
HEMOGLOBIN: 15.3 g/dL (ref 13.0–17.0)
MCH: 30 pg (ref 26.0–34.0)
MCHC: 33.9 g/dL (ref 30.0–36.0)
MCV: 88.4 fL (ref 78.0–100.0)
PLATELETS: 268 10*3/uL (ref 150–400)
RBC: 5.1 MIL/uL (ref 4.22–5.81)
RDW: 13.4 % (ref 11.5–15.5)
WBC: 10.9 10*3/uL — ABNORMAL HIGH (ref 4.0–10.5)

## 2017-03-12 LAB — BASIC METABOLIC PANEL
Anion gap: 11 (ref 5–15)
BUN: 11 mg/dL (ref 6–20)
CHLORIDE: 102 mmol/L (ref 101–111)
CO2: 25 mmol/L (ref 22–32)
CREATININE: 0.81 mg/dL (ref 0.61–1.24)
Calcium: 9.7 mg/dL (ref 8.9–10.3)
GFR calc non Af Amer: >60 mL/min (ref >60)
Glucose, Bld: 124 mg/dL — ABNORMAL HIGH (ref 65–99)
Potassium: 4.2 mmol/L (ref 3.5–5.1)
Sodium: 138 mmol/L (ref 135–145)

## 2017-03-12 LAB — I-STAT TROPONIN, ED
TROPONIN I, POC: 0 ng/mL (ref 0.00–0.08)
Troponin i, poc: 0 ng/mL (ref 0.00–0.08)

## 2017-03-12 MED ORDER — LORAZEPAM 1 MG PO TABS: 1.0000 mg | ORAL_TABLET | Freq: Three times a day (TID) | ORAL | 0 refills | Status: DC | PRN

## 2017-03-12 MED ORDER — ASPIRIN 81 MG PO CHEW
324.0000 mg | CHEWABLE_TABLET | Freq: Once | ORAL | Status: AC
  Administered 2017-03-12: 324 mg via ORAL
  Filled 2017-03-12: qty 4

## 2017-03-12 NOTE — Discharge Instructions (Signed)
Return to the ED with any concerns including chest pain, difficulty breathing, fainting, decreased level of alertness/lethargy, or any other alarming symptoms °

## 2017-03-12 NOTE — ED Triage Notes (Signed)
Per Pt, Pt is coming from home with complaints of left-sided chest pain and SOB that started about a week ago. Pt reports stress at work and increased anxiety. Complains the pain to be intermittent and worsened today at work.

## 2017-03-12 NOTE — ED Notes (Signed)
Pt verbalized understanding of d/c instructions and has no further questions. VSS, NAD. Pt removed all belongings from room.  

## 2017-03-12 NOTE — ED Provider Notes (Signed)
[facility]-EMERGENCY DEPT Provider Note   CSN: [id number] Arrival date & time: 03/12/17  1105     History   Chief Complaint Chief Complaint  Patient presents with  . Chest Pain    HPI Edward Perez is a 36 y.o. male.  HPI  Pt with hx of hypertension presenting with c/o chest pain.  He states chest pain began earlier today while at work.  Chest pain was sharp and associated with shortness of breath and he felt very anxious.  He states he feels he may have been having an anxiety attack.  Symptoms resolved after leaving work and prior to arriving in the ED.  No diaphoresis, no radiation of pain.  Pain sharp and in anterior left chest.  He states he has had similar symptoms intermittently over the past week, but sharp chest pains began today.  He has hx of hypertension and states he is compliant with his medications.  He is also a current smoker.  No family hx of early MI.  No hx of DVT/PE, no recent travel/trauma/surgery.  There are no other associated systemic symptoms, there are no other alleviating or modifying factors.   Past Medical History:  Diagnosis Date  . Arthritis   . GERD (gastroesophageal reflux disease)   . Headache(784.0)    migraines   . Hypertension    takes meds daily    Patient Active Problem List   Diagnosis Date Noted  . 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 ANTERIOR CERVICAL DECOMP/DISCECTOMY FUSION  05/31/2012   Procedure: ANTERIOR CERVICAL DECOMPRESSION/DISCECTOMY FUSION 2 LEVELS;  Surgeon: Cristi Loron, MD;  Location: [facility];  Service: Neurosurgery;  Laterality: N/A;  Cervical three-four Cervical four-five anterior cervical decompression with fusion interbody prothesis plating and bonegraft  . KNEE SURGERY  2006   right       Home Medications    Prior to Admission medications   Medication Sig Start Date End Date Taking? Authorizing Provider  amLODipine-benazepril (LOTREL) 10-40 MG  capsule Take 1 capsule by mouth daily. 02/16/17  Yes [provider]  diazepam (VALIUM) 5 MG tablet Take 1 tablet (5 mg total) by mouth every 6 (six) hours as needed. 06/01/12  Yes Tressie Stalker, MD  omeprazole (PRILOSEC) 20 MG capsule Take 20 mg by mouth every morning.    Yes [provider]  docusate sodium 100 MG CAPS Take 100 mg by mouth 2 (two) times daily. Patient not taking: Reported on 03/12/2017 06/01/12   Tressie Stalker, MD  LORazepam (ATIVAN) 1 MG tablet Take 1 tablet (1 mg total) by mouth 3 (three) times daily as needed for anxiety. 03/12/17   Mabe, Latanya Maudlin, MD  oxyCODONE-acetaminophen (PERCOCET) 10-325 MG per tablet Take 1 tablet by mouth every 4 (four) hours as needed for pain. Patient not taking: Reported on 03/12/2017 06/01/12   Tressie Stalker, MD    Family History No family history on file.  Social History Social History  Substance Use Topics  . Smoking status: Current Every Day Smoker    Packs/day: 1.50    Types: Cigarettes  . Smokeless tobacco: Never Used  . Alcohol use No     Allergies   Patient has no known allergies.   Review of Systems Review of Systems  ROS reviewed and all otherwise negative except for mentioned in HPI   Physical Exam Updated Vital Signs BP 134/72   Pulse 81   Temp 98.2 F (36.8 C) (Oral)  Resp (!) 21   Ht 6\' 4"  (1.93 m)   Wt 115.7 kg (255 lb)   SpO2 96%   BMI 31.04 kg/m  Vitals reviewed Physical Exam Physical Examination: General appearance - alert, well appearing, and in no distress Mental status - alert, oriented to person, place, and time Eyes - pupils equal and reactive, extraocular eye movements intact Chest - clear to auscultation, no wheezes, rales or rhonchi, symmetric air entry Heart - normal rate, regular rhythm, normal S1, S2, no murmurs, rubs, clicks or gallops Abdomen - soft, nontender, nondistended, no masses or organomegaly Neurological - alert, oriented, normal speech, no focal  findings or movement disorder noted Extremities - peripheral pulses normal, no pedal edema, no clubbing or cyanosis Skin - normal coloration and turgor, no rashes, no suspicious skin lesions noted Psych- calm pleasant, cooperative  ED Treatments / Results  Labs (all labs ordered are listed, but only abnormal results are displayed) Labs Reviewed  BASIC METABOLIC PANEL - Abnormal; Notable for the following:       Result Value   Glucose, Bld 124 (*)    All other components within normal limits  CBC - Abnormal; Notable for the following:    WBC 10.9 (*)    All other components within normal limits  I-STAT TROPONIN, ED  I-STAT TROPONIN, ED    EKG  EKG Interpretation  Date/Time:  Thursday March 12 2017 11:08:13 EDT Ventricular Rate:  96 PR Interval:  132 QRS Duration: 84 QT Interval:  340 QTC Calculation: 429 R Axis:   54 Text Interpretation:  Normal sinus rhythm Normal ECG No significant change since last tracing Confirmed by Jerelyn ScottLinker, Martha [phone]([id number]) on 03/12/2017 11:42:13 AM       Radiology Dg Chest 2 View  Result Date: 03/12/2017 CLINICAL DATA:  Left-sided chest pain and shortness of breath. EXAM: CHEST  2 VIEW COMPARISON:  Chest x-ray dated October 18, 2012. FINDINGS: The heart size and mediastinal contours are within normal limits. Mild interstitial accentuation, as can commonly be encountered in smokers. Both lungs are otherwise clear. No focal consolidation, pleural effusion, or pneumothorax. The visualized skeletal structures are unremarkable. IMPRESSION: No active cardiopulmonary disease. Electronically Signed   By: Obie DredgeWilliam T Derry M.D.   On: 03/12/2017 12:18    Procedures Procedures (including critical care time)  Medications Ordered in ED Medications  aspirin chewable tablet 324 mg (324 mg Oral Given 03/12/17 1238)     Initial Impression / Assessment and Plan / ED Course  I have reviewed the triage vital signs and the nursing notes.  Pertinent labs & imaging results  that were available during my care of the patient were reviewed by me and considered in my medical decision making (see chart for details).     Pt presenting with c/o chest pain and shortness of breath and anxiety.  Workup in the ED is reassuring.  Pt has low heart score,  2 sets of troponins are negative. PERC negative.  Low risk for PE.  Pt given small amount of ativan for home use.  adivsed to f/u closely with PMD.  Discharged with strict return precautions.  Pt agreeable with plan.  Final Clinical Impressions(s) / ED Diagnoses   Final diagnoses:  Nonspecific chest pain  Anxiety    New Prescriptions Discharge Medication List as of 03/12/2017  3:54 PM    START taking these medications   Details  LORazepam (ATIVAN) 1 MG tablet Take 1 tablet (1 mg total) by mouth 3 (three) times daily as needed  for anxiety., Starting Thu 03/12/2017, Print         Mabe, Latanya MaudlinMartha L, MD 03/12/17 [phone]

## 2018-05-31 ENCOUNTER — Other Ambulatory Visit: Payer: Self-pay | Admitting: Neurosurgery

## 2018-05-31 DIAGNOSIS — M4312 Spondylolisthesis, cervical region: Secondary | ICD-10-CM

## 2018-06-11 ENCOUNTER — Ambulatory Visit
Admission: RE | Admit: 2018-06-11 | Discharge: 2018-06-11 | Disposition: A | Discharge status: Home / Self Care | Payer: No Typology Code available for payment source | Source: Ambulatory Visit | Attending: Neurosurgery | Admitting: Neurosurgery

## 2018-06-11 DIAGNOSIS — M4312 Spondylolisthesis, cervical region: Secondary | ICD-10-CM

## 2019-12-15 IMAGING — MR MR CERVICAL SPINE W/O CM
4 of 5 series · 25 of 48 positions shown · non-contrast
Comparison: None.

CLINICAL DATA: Acquired spondylolisthesis of cervical vertebrae.

EXAM:
MRI CERVICAL SPINE WITHOUT CONTRAST
TECHNIQUE: Multiplanar, multisequence MR imaging of the cervical spine was
performed. No intravenous contrast was administered.

[Series 3: T2 post-contrast · sagittal · 3.3mm · 0.39mm/px · 6 of 13 slices shown]
[im 1/13]
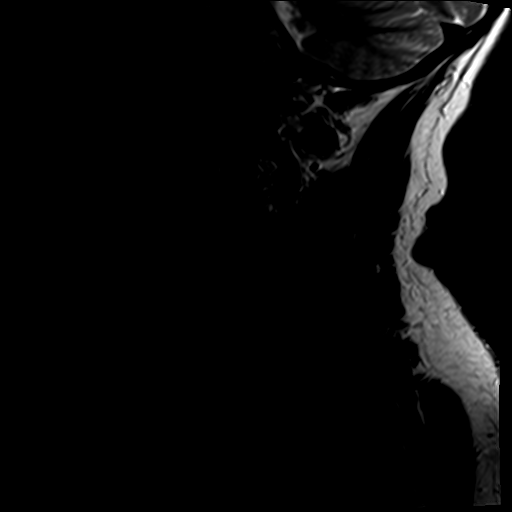
[im 3/13]
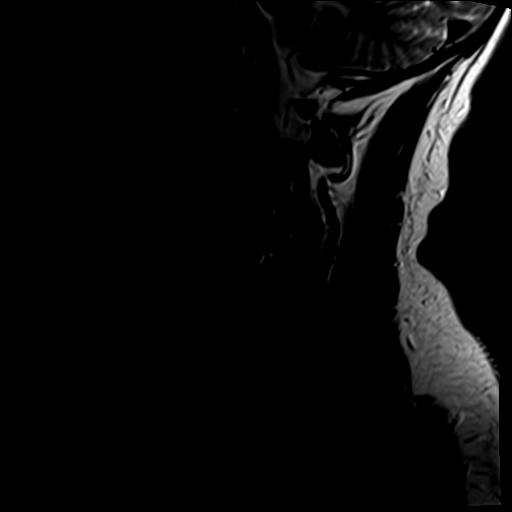
[im 5/13]
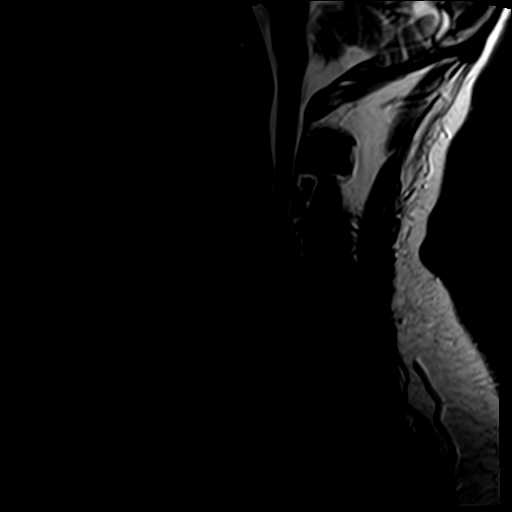
[im 8/13]
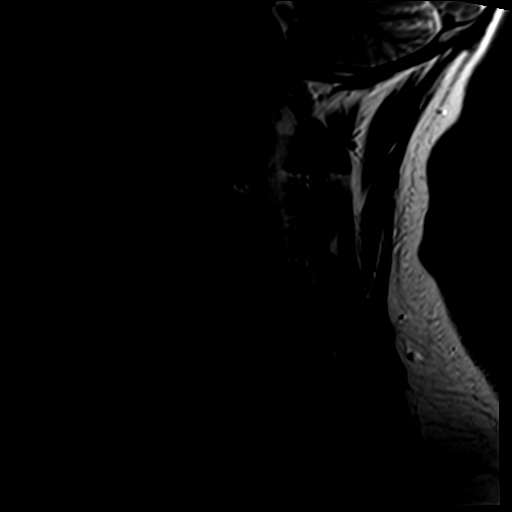
[im 10/13]
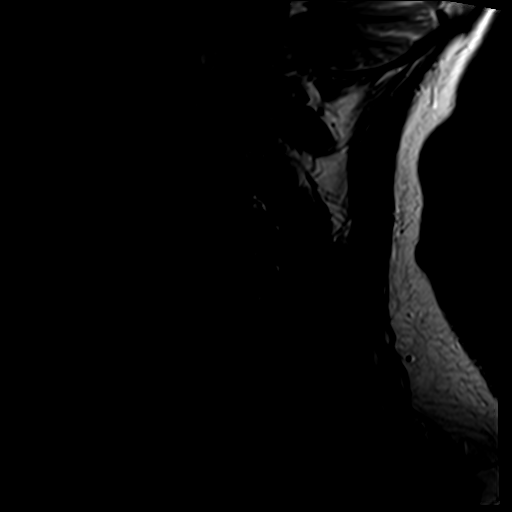
[im 13/13]
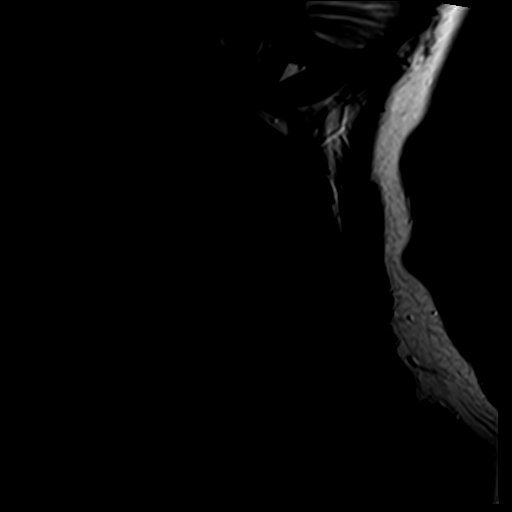

[Series 4: T1 · sagittal · 3.3mm · 0.39mm/px · 6 of 13 slices shown]
[im 1/13]
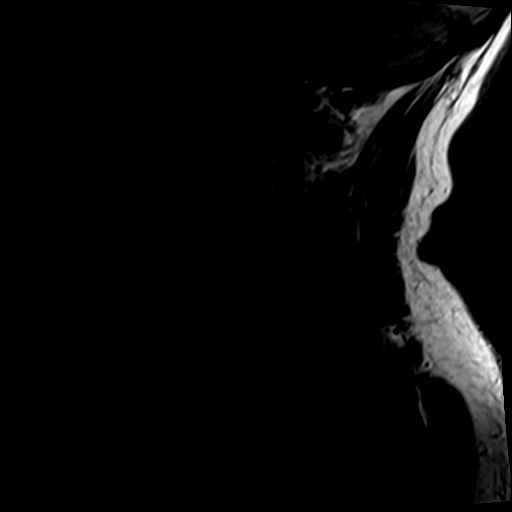
[im 3/13]
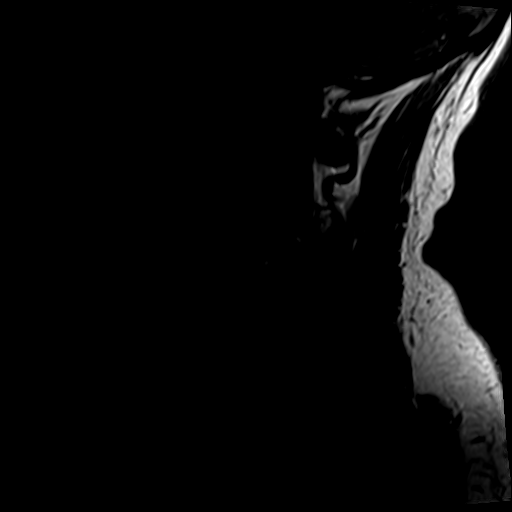
[im 5/13]
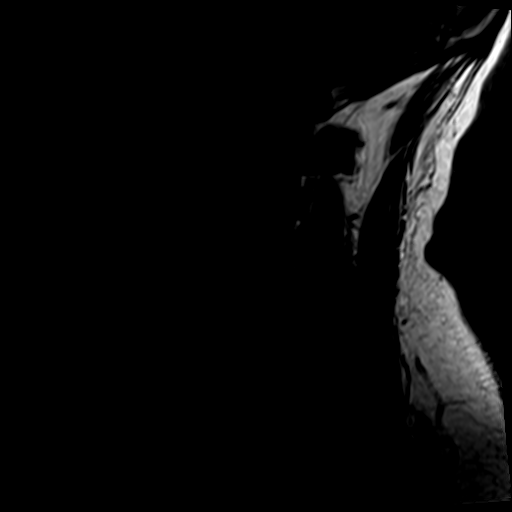
[im 8/13]
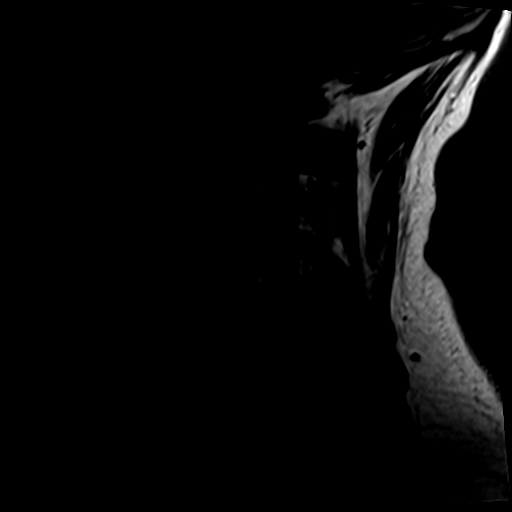
[im 10/13]
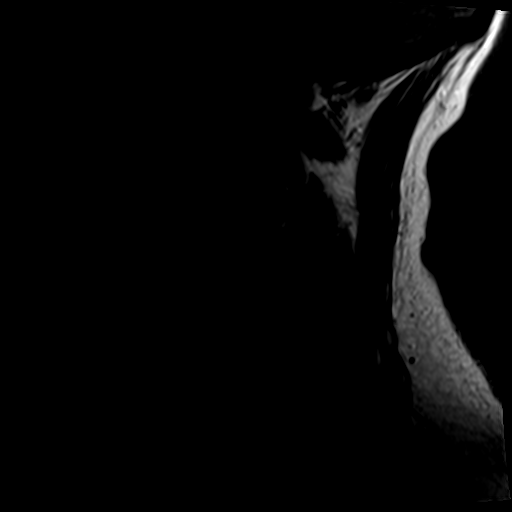
[im 13/13]
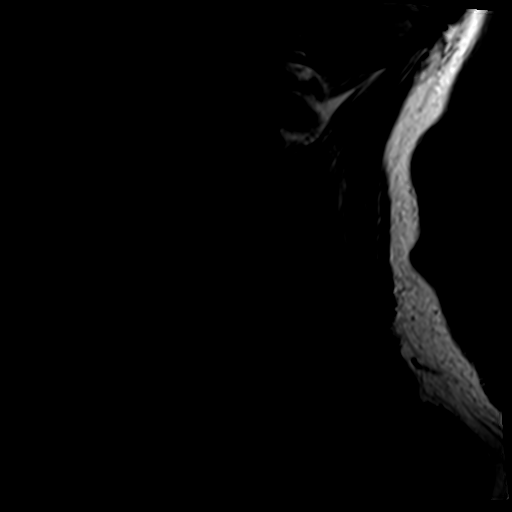

[Series 5: tir sag · sagittal · 3.3mm · 0.39mm/px · 4 of 13 slices shown]
[im 1/13]
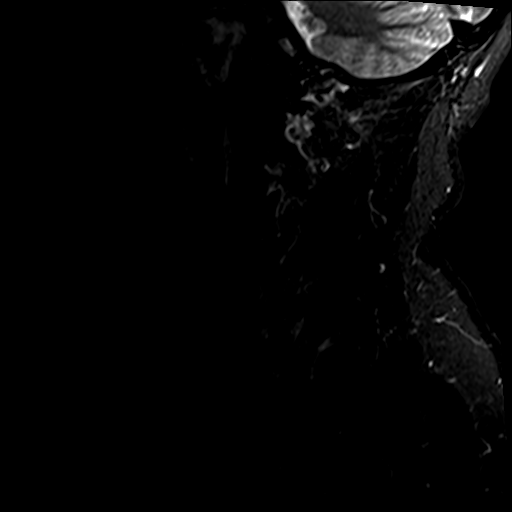
[im 3/13]
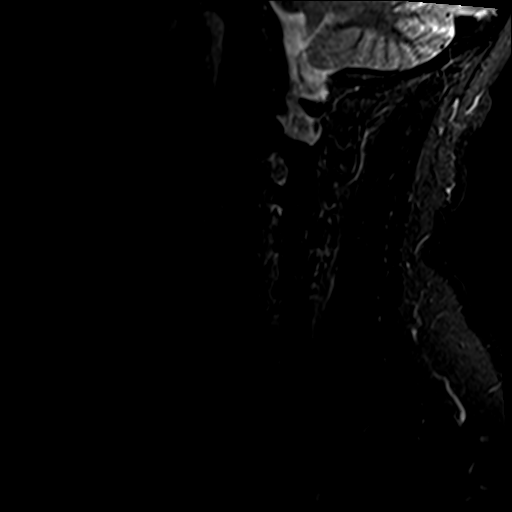
[im 8/13]
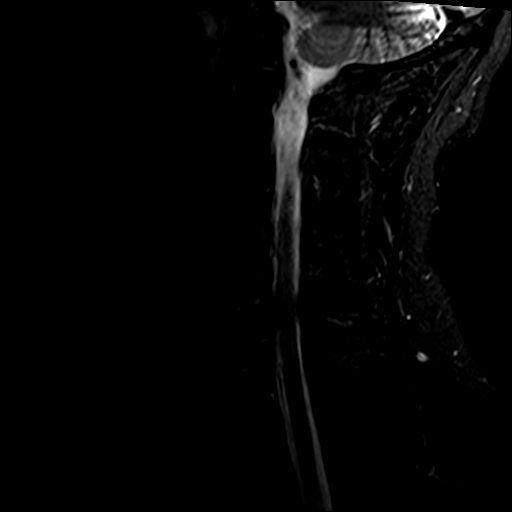
[im 13/13]
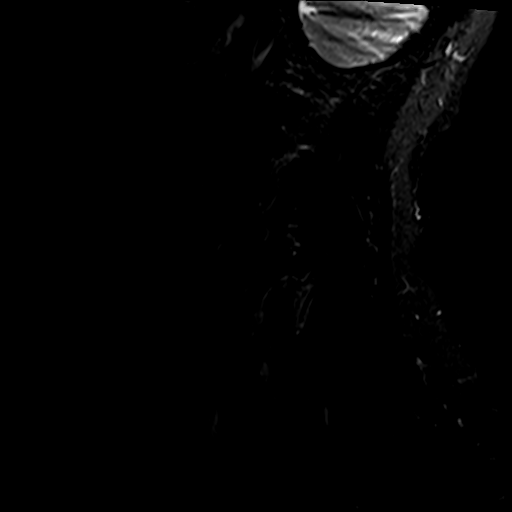

[Series 8: T2 · axial · 3.0mm · 0.70mm/px · z∈[-57,+47]mm · 9 of 30 slices shown]
[im 1/30]
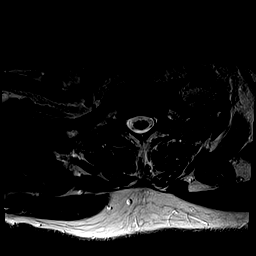
[im 5/30]
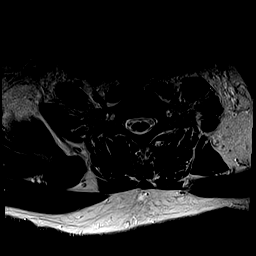
[im 9/30]
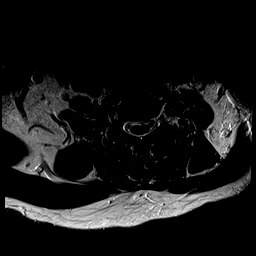
[im 13/30]
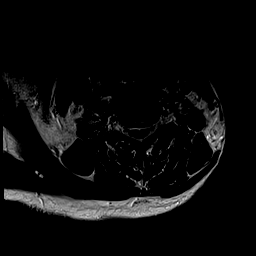
[im 15/30]
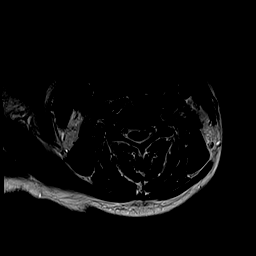
[im 17/30]
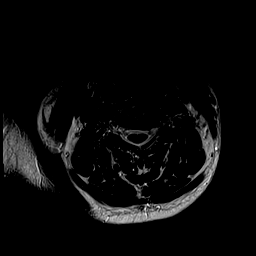
[im 21/30]
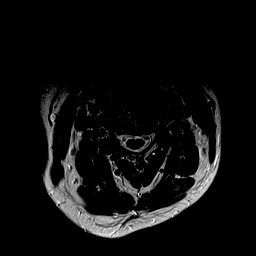
[im 25/30]
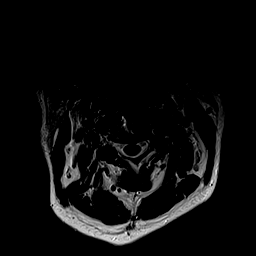
[im 30/30]
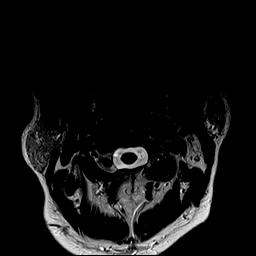

[25 of 48 positions shown; findings below may reference images not displayed]

FINDINGS: Alignment: Normal

Vertebrae: No fracture, evidence of discitis, or bone lesion. C3-C5
ACDF with solid arthrodesis.

Cord: Normal signal and morphology.

Posterior Fossa, vertebral arteries, paraspinal tissues: Negative

Disc levels:

C2-3: Inferiorly migrating central disc protrusion without cord mass
effect.

C3-4: ACDF with solid arthrodesis and no impingement.

C4-5: ACDF with solid arthrodesis and no impingement

C5-6: Broad central disc protrusion with mild ventral cord
flattening. Patent foramina

C6-7: Unremarkable.

C7-T1:Unremarkable.
IMPRESSION: 1. C5-6 central disc protrusion with mild ventral cord flattening.
2. C2-3 inferiorly migrating disc herniation without cord mass
effect.
3. C3-4 and C4-5 ACDF solid arthrodesis and no residual impingement.

## 2022-07-18 HISTORY — PX: CHOLECYSTECTOMY: SHX55

## 2023-03-17 ENCOUNTER — Other Ambulatory Visit: Payer: Self-pay | Admitting: Neurosurgery

## 2023-03-17 DIAGNOSIS — M4722 Other spondylosis with radiculopathy, cervical region: Secondary | ICD-10-CM

## 2023-03-23 ENCOUNTER — Ambulatory Visit
Admission: RE | Admit: 2023-03-23 | Discharge: 2023-03-23 | Disposition: A | Discharge status: Home / Self Care | Payer: BC Managed Care – PPO | Source: Ambulatory Visit | Attending: Neurosurgery | Admitting: Neurosurgery

## 2023-03-23 DIAGNOSIS — M4722 Other spondylosis with radiculopathy, cervical region: Secondary | ICD-10-CM

## 2023-04-06 ENCOUNTER — Other Ambulatory Visit: Payer: Self-pay | Admitting: Neurosurgery

## 2023-04-07 ENCOUNTER — Other Ambulatory Visit: Payer: Self-pay | Admitting: Neurosurgery

## 2023-04-08 NOTE — Progress Notes (Signed)
Surgical Instructions   Your procedure is scheduled on Thursday April 16, 2023. Report to [facility] Main Entrance "A" at 11:40 A.M., then check in with the Admitting office. Any questions or running late day of surgery: call [phone]  Questions prior to your surgery date: call [phone], Monday-Friday, 8am-4pm. If you experience any cold or flu symptoms such as cough, fever, chills, shortness of breath, etc. between now and your scheduled surgery, please notify us at the above number.     Remember:  Do not eat or drink  after midnight the night before your surgery   Take these medicines the morning of surgery with A SIP OF WATER  amLODipine (NORVASC)  escitalopram (LEXAPRO)  metoprolol succinate (TOPROL-XL)  omeprazole (PRILOSEC)    May take these medicines IF NEEDED: propranolol (INDERAL)    One week prior to surgery, STOP taking any Aspirin (unless otherwise instructed by your surgeon) Aleve, Naproxen, Ibuprofen, Motrin, Advil, Goody's, BC's, all herbal medications, fish oil, and non-prescription vitamins.                     Do NOT Smoke (Tobacco/Vaping) for 24 hours prior to your procedure.  If you use a CPAP at night, you may bring your mask/headgear for your overnight stay.   You will be asked to remove any contacts, glasses, piercing's, hearing aid's, dentures/partials prior to surgery. Please bring cases for these items if needed.    Patients discharged the day of surgery will not be allowed to drive home, and someone needs to stay with them for 24 hours.  SURGICAL WAITING ROOM VISITATION Patients may have no more than 2 support people in the waiting area - these visitors may rotate.   Pre-op nurse will coordinate an appropriate time for 1 ADULT support person, who may not rotate, to accompany patient in pre-op.  Children under the age of 68 must have an adult with them who is not the patient and must remain in the main waiting area with an adult.  If the  patient needs to stay at the hospital during part of their recovery, the visitor guidelines for inpatient rooms apply.  Please refer to the [facility] website for the visitor guidelines for any additional information.   If you received a COVID test during your pre-op visit  it is requested that you wear a mask when out in public, stay away from anyone that may not be feeling well and notify your surgeon if you develop symptoms. If you have been in contact with anyone that has tested positive in the last 10 days please notify you surgeon.      Pre-operative 5 CHG Bathing Instructions   You can play a key role in reducing the risk of infection after surgery. Your skin needs to be as free of germs as possible. You can reduce the number of germs on your skin by washing with CHG (chlorhexidine gluconate) soap before surgery. CHG is an antiseptic soap that kills germs and continues to kill germs even after washing.   DO NOT use if you have an allergy to chlorhexidine/CHG or antibacterial soaps. If your skin becomes reddened or irritated, stop using the CHG and notify one of our RNs at [phone].   Please shower with the CHG soap starting 4 days before surgery using the following schedule:     Please keep in mind the following:  DO NOT shave, including legs and underarms, starting the day of your first shower.   You  may shave your face at any point before/day of surgery.  Place clean sheets on your bed the day you start using CHG soap. Use a clean washcloth (not used since being washed) for each shower. DO NOT sleep with pets once you start using the CHG.   CHG Shower Instructions:  If you choose to wash your hair and private area, wash first with your normal shampoo/soap.  After you use shampoo/soap, rinse your hair and body thoroughly to remove shampoo/soap residue.  Turn the water OFF and apply about 3 tablespoons (45 ml) of CHG soap to a CLEAN washcloth.  Apply CHG soap ONLY FROM YOUR  NECK DOWN TO YOUR TOES (washing for 3-5 minutes)  DO NOT use CHG soap on face, private areas, open wounds, or sores.  Pay special attention to the area where your surgery is being performed.  If you are having back surgery, having someone wash your back for you may be helpful. Wait 2 minutes after CHG soap is applied, then you may rinse off the CHG soap.  Pat dry with a clean towel  Put on clean clothes/pajamas   If you choose to wear lotion, please use ONLY the CHG-compatible lotions on the back of this paper.   Additional instructions for the day of surgery: DO NOT APPLY any lotions, deodorants, cologne, or perfumes.   Do not bring valuables to the hospital. [facility] is not responsible for any belongings/valuables. Put on clean/comfortable clothes.  Please brush your teeth.  Ask your nurse before applying any prescription medications to the skin.     CHG Compatible Lotions   Aveeno Moisturizing lotion  Cetaphil Moisturizing Cream  Cetaphil Moisturizing Lotion  Clairol Herbal Essence Moisturizing Lotion, Dry Skin  Clairol Herbal Essence Moisturizing Lotion, Extra Dry Skin  Clairol Herbal Essence Moisturizing Lotion, Normal Skin  Curel Age Defying Therapeutic Moisturizing Lotion with Alpha Hydroxy  Curel Extreme Care Body Lotion  Curel Soothing Hands Moisturizing Hand Lotion  Curel Therapeutic Moisturizing Cream, Fragrance-Free  Curel Therapeutic Moisturizing Lotion, Fragrance-Free  Curel Therapeutic Moisturizing Lotion, Original Formula  Eucerin Daily Replenishing Lotion  Eucerin Dry Skin Therapy Plus Alpha Hydroxy Crme  Eucerin Dry Skin Therapy Plus Alpha Hydroxy Lotion  Eucerin Original Crme  Eucerin Original Lotion  Eucerin Plus Crme Eucerin Plus Lotion  Eucerin TriLipid Replenishing Lotion  Keri Anti-Bacterial Hand Lotion  Keri Deep Conditioning Original Lotion Dry Skin Formula Softly Scented  Keri Deep Conditioning Original Lotion, Fragrance Free Sensitive Skin  Formula  Keri Lotion Fast Absorbing Fragrance Free Sensitive Skin Formula  Keri Lotion Fast Absorbing Softly Scented Dry Skin Formula  Keri Original Lotion  Keri Skin Renewal Lotion Keri Silky Smooth Lotion  Keri Silky Smooth Sensitive Skin Lotion  Nivea Body Creamy Conditioning Oil  Nivea Body Extra Enriched Lotion  Nivea Body Original Lotion  Nivea Body Sheer Moisturizing Lotion Nivea Crme  Nivea Skin Firming Lotion  NutraDerm 30 Skin Lotion  NutraDerm Skin Lotion  NutraDerm Therapeutic Skin Cream  NutraDerm Therapeutic Skin Lotion  ProShield Protective Hand Cream  Provon moisturizing lotion  Please read over the following fact sheets that you were given.

## 2023-04-09 ENCOUNTER — Encounter (HOSPITAL_COMMUNITY): Payer: Self-pay

## 2023-04-09 ENCOUNTER — Other Ambulatory Visit: Payer: Self-pay

## 2023-04-09 ENCOUNTER — Encounter (HOSPITAL_COMMUNITY)
Admission: RE | Admit: 2023-04-09 | Discharge: 2023-04-09 | Disposition: A | Discharge status: Home / Self Care | Payer: BC Managed Care – PPO | Source: Ambulatory Visit | Attending: Neurosurgery | Admitting: Neurosurgery

## 2023-04-09 VITALS — BP 130/79 | HR 94 | Temp 98.5°F | Resp 18 | Ht 76.0 in | Wt 260.2 lb

## 2023-04-09 DIAGNOSIS — I2510 Atherosclerotic heart disease of native coronary artery without angina pectoris: Secondary | ICD-10-CM | POA: Diagnosis not present

## 2023-04-09 DIAGNOSIS — Z01818 Encounter for other preprocedural examination: Secondary | ICD-10-CM

## 2023-04-09 DIAGNOSIS — Z01812 Encounter for preprocedural laboratory examination: Secondary | ICD-10-CM | POA: Diagnosis not present

## 2023-04-09 DIAGNOSIS — Z01810 Encounter for preprocedural cardiovascular examination: Secondary | ICD-10-CM | POA: Insufficient documentation

## 2023-04-09 HISTORY — DX: Anxiety disorder, unspecified: F41.9

## 2023-04-09 HISTORY — DX: Myoneural disorder, unspecified: G70.9

## 2023-04-09 HISTORY — DX: Depression, unspecified: F32.A

## 2023-04-09 LAB — CBC
HCT: 46.2 % (ref 39.0–52.0)
Hemoglobin: 15.5 g/dL (ref 13.0–17.0)
MCH: 29.9 pg (ref 26.0–34.0)
MCHC: 33.5 g/dL (ref 30.0–36.0)
MCV: 89.2 fL (ref 80.0–100.0)
Platelets: 299 10*3/uL (ref 150–400)
RBC: 5.18 MIL/uL (ref 4.22–5.81)
RDW: 12.8 % (ref 11.5–15.5)
WBC: 12 10*3/uL — ABNORMAL HIGH (ref 4.0–10.5)
nRBC: 0 % (ref 0.0–0.2)

## 2023-04-09 LAB — BASIC METABOLIC PANEL
Anion gap: 10 (ref 5–15)
BUN: 13 mg/dL (ref 6–20)
CO2: 25 mmol/L (ref 22–32)
Calcium: 9.5 mg/dL (ref 8.9–10.3)
Chloride: 103 mmol/L (ref 98–111)
Creatinine, Ser: 0.82 mg/dL (ref 0.61–1.24)
GFR, Estimated: >60 mL/min (ref >60)
Glucose, Bld: 94 mg/dL (ref 70–99)
Potassium: 4.3 mmol/L (ref 3.5–5.1)
Sodium: 138 mmol/L (ref 135–145)

## 2023-04-09 LAB — TYPE AND SCREEN
ABO/RH(D): O POS
Antibody Screen: NEGATIVE

## 2023-04-09 LAB — SURGICAL PCR SCREEN
MRSA, PCR: NEGATIVE
Staphylococcus aureus: NEGATIVE

## 2023-04-09 NOTE — Progress Notes (Signed)
PCP - Francee Piccolo, NP Cardiologist - Denies  PPM/ICD - Denies Device Orders - n/a Rep Notified - n/a  Chest x-ray - Denies EKG - 04/09/2023 Stress Test - Denies ECHO - Denies Cardiac Cath - Denies  Sleep Study - +OSA. Wears CPAP nightly. Automatic pressure setting with range 5-13.  No DM  Last dose of GLP1 agonist- n/a GLP1 instructions: n/a  Blood Thinner Instructions: n/a Aspirin Instructions: n/a  NPO after midnight  COVID TEST- n/a   Anesthesia review: No. Pt takes three BP medications for his HTN. He states he has a very strong history of cardiovascular dx. He went to ED years ago with CP and BP 180s/120s, which is when he was dx with high blood pressure. He is very compliant with taking medications and as long as he does that his BP is well controlled. Discussed with Antionette Poles, PA-C due to needing to hold ACE Inhibitor DOS. Pt also advised to not take metoprolol AND propranolol (for migraines PRN) unless absolutely necessary due to dropping BP and HR too low. Pt understood instructions.   Patient denies shortness of breath, fever, cough and chest pain at PAT appointment. Pt denies any respiratory illness/infection in the last two months.   All instructions explained to the patient, with a verbal understanding of the material. Patient agrees to go over the instructions while at home for a better understanding. Patient also instructed to self quarantine after being tested for COVID-19. The opportunity to ask questions was provided.

## 2023-04-16 ENCOUNTER — Ambulatory Visit (HOSPITAL_COMMUNITY): Payer: BC Managed Care – PPO | Admitting: Physician Assistant

## 2023-04-16 ENCOUNTER — Ambulatory Visit (HOSPITAL_COMMUNITY)
Admission: RE | Admit: 2023-04-16 | Discharge: 2023-04-17 | Disposition: A | Discharge status: Home / Self Care | Payer: BC Managed Care – PPO | Attending: Neurosurgery | Admitting: Neurosurgery

## 2023-04-16 ENCOUNTER — Encounter (HOSPITAL_COMMUNITY)
Admission: RE | Disposition: A | Discharge status: Home / Self Care | Payer: Self-pay | Source: Home / Self Care | Attending: Neurosurgery

## 2023-04-16 ENCOUNTER — Encounter (HOSPITAL_COMMUNITY): Payer: Self-pay | Admitting: Neurosurgery

## 2023-04-16 ENCOUNTER — Ambulatory Visit (HOSPITAL_COMMUNITY): Payer: BC Managed Care – PPO

## 2023-04-16 ENCOUNTER — Other Ambulatory Visit: Payer: Self-pay

## 2023-04-16 DIAGNOSIS — Z981 Arthrodesis status: Secondary | ICD-10-CM | POA: Insufficient documentation

## 2023-04-16 DIAGNOSIS — M4802 Spinal stenosis, cervical region: Secondary | ICD-10-CM | POA: Insufficient documentation

## 2023-04-16 DIAGNOSIS — M4722 Other spondylosis with radiculopathy, cervical region: Secondary | ICD-10-CM | POA: Diagnosis present

## 2023-04-16 DIAGNOSIS — F419 Anxiety disorder, unspecified: Secondary | ICD-10-CM | POA: Insufficient documentation

## 2023-04-16 DIAGNOSIS — K219 Gastro-esophageal reflux disease without esophagitis: Secondary | ICD-10-CM | POA: Insufficient documentation

## 2023-04-16 DIAGNOSIS — M4712 Other spondylosis with myelopathy, cervical region: Secondary | ICD-10-CM | POA: Insufficient documentation

## 2023-04-16 DIAGNOSIS — G4730 Sleep apnea, unspecified: Secondary | ICD-10-CM | POA: Insufficient documentation

## 2023-04-16 DIAGNOSIS — F17210 Nicotine dependence, cigarettes, uncomplicated: Secondary | ICD-10-CM | POA: Insufficient documentation

## 2023-04-16 DIAGNOSIS — I10 Essential (primary) hypertension: Secondary | ICD-10-CM | POA: Insufficient documentation

## 2023-04-16 HISTORY — PX: ANTERIOR CERVICAL DECOMP/DISCECTOMY FUSION: SHX1161

## 2023-04-16 HISTORY — DX: Sleep apnea, unspecified: G47.30

## 2023-04-16 LAB — ABO/RH: ABO/RH(D): O POS

## 2023-04-16 SURGERY — ANTERIOR CERVICAL DECOMPRESSION/DISCECTOMY FUSION 2 LEVEL/HARDWARE REMOVAL
Anesthesia: General

## 2023-04-16 MED ORDER — CEFAZOLIN SODIUM-DEXTROSE 2-4 GM/100ML-% IV SOLN
2.0000 g | INTRAVENOUS | Status: AC
  Administered 2023-04-16: 2 g via INTRAVENOUS
  Filled 2023-04-16: qty 100

## 2023-04-16 MED ORDER — BISACODYL 10 MG RE SUPP: 10.0000 mg | Freq: Every day | RECTAL | Status: DC | PRN

## 2023-04-16 MED ORDER — METOPROLOL SUCCINATE ER 25 MG PO TB24
25.0000 mg | ORAL_TABLET | Freq: Every day | ORAL | Status: DC
  Administered 2023-04-17: 25 mg via ORAL
  Filled 2023-04-16: qty 1

## 2023-04-16 MED ORDER — DEXAMETHASONE 4 MG PO TABS: 4.0000 mg | ORAL_TABLET | Freq: Four times a day (QID) | ORAL | Status: AC

## 2023-04-16 MED ORDER — PHENYLEPHRINE HCL-NACL 20-0.9 MG/250ML-% IV SOLN
INTRAVENOUS | Status: AC
  Filled 2023-04-16: qty 250

## 2023-04-16 MED ORDER — OXYCODONE HCL 5 MG PO TABS
10.0000 mg | ORAL_TABLET | ORAL | Status: DC | PRN
  Administered 2023-04-16 – 2023-04-17 (×4): 10 mg via ORAL
  Filled 2023-04-16 (×4): qty 2

## 2023-04-16 MED ORDER — CHLORHEXIDINE GLUCONATE CLOTH 2 % EX PADS: 6.0000 | MEDICATED_PAD | Freq: Once | CUTANEOUS | Status: DC

## 2023-04-16 MED ORDER — OXYCODONE HCL 5 MG PO TABS: 5.0000 mg | ORAL_TABLET | Freq: Once | ORAL | Status: DC | PRN

## 2023-04-16 MED ORDER — ESCITALOPRAM OXALATE 20 MG PO TABS
20.0000 mg | ORAL_TABLET | Freq: Every day | ORAL | Status: DC
  Administered 2023-04-17: 20 mg via ORAL
  Filled 2023-04-16: qty 1

## 2023-04-16 MED ORDER — OXYCODONE HCL 5 MG PO TABS: 5.0000 mg | ORAL_TABLET | ORAL | Status: DC | PRN

## 2023-04-16 MED ORDER — ACETAMINOPHEN 500 MG PO TABS
1000.0000 mg | ORAL_TABLET | Freq: Four times a day (QID) | ORAL | Status: DC
  Administered 2023-04-16 – 2023-04-17 (×3): 1000 mg via ORAL
  Filled 2023-04-16 (×3): qty 2

## 2023-04-16 MED ORDER — EPHEDRINE 5 MG/ML INJ
INTRAVENOUS | Status: AC
  Filled 2023-04-16: qty 5

## 2023-04-16 MED ORDER — ACETAMINOPHEN 10 MG/ML IV SOLN: 1000.0000 mg | Freq: Once | INTRAVENOUS | Status: DC | PRN

## 2023-04-16 MED ORDER — PANTOPRAZOLE SODIUM 40 MG PO TBEC
40.0000 mg | DELAYED_RELEASE_TABLET | Freq: Every day | ORAL | Status: DC
  Administered 2023-04-17: 40 mg via ORAL
  Filled 2023-04-16: qty 1

## 2023-04-16 MED ORDER — PROPOFOL 10 MG/ML IV BOLUS
INTRAVENOUS | Status: DC | PRN
  Administered 2023-04-16: 20 mg via INTRAVENOUS
  Administered 2023-04-16: 150 mg via INTRAVENOUS
  Administered 2023-04-16: 20 mg via INTRAVENOUS

## 2023-04-16 MED ORDER — ACETAMINOPHEN 650 MG RE SUPP: 650.0000 mg | RECTAL | Status: DC | PRN

## 2023-04-16 MED ORDER — CYCLOBENZAPRINE HCL 10 MG PO TABS
10.0000 mg | ORAL_TABLET | Freq: Three times a day (TID) | ORAL | Status: DC | PRN
  Administered 2023-04-16 – 2023-04-17 (×2): 10 mg via ORAL
  Filled 2023-04-16 (×2): qty 1

## 2023-04-16 MED ORDER — PHENYLEPHRINE 80 MCG/ML (10ML) SYRINGE FOR IV PUSH (FOR BLOOD PRESSURE SUPPORT)
PREFILLED_SYRINGE | INTRAVENOUS | Status: AC
  Filled 2023-04-16: qty 10

## 2023-04-16 MED ORDER — ONDANSETRON HCL 4 MG/2ML IJ SOLN
INTRAMUSCULAR | Status: DC | PRN
  Administered 2023-04-16: 4 mg via INTRAVENOUS

## 2023-04-16 MED ORDER — OXYCODONE HCL 5 MG/5ML PO SOLN: 5.0000 mg | Freq: Once | ORAL | Status: DC | PRN

## 2023-04-16 MED ORDER — THROMBIN 5000 UNITS EX SOLR
CUTANEOUS | Status: AC
  Filled 2023-04-16: qty 5000

## 2023-04-16 MED ORDER — AMLODIPINE BESYLATE 10 MG PO TABS
10.0000 mg | ORAL_TABLET | Freq: Every day | ORAL | Status: DC
  Administered 2023-04-17: 10 mg via ORAL
  Filled 2023-04-16: qty 1

## 2023-04-16 MED ORDER — ARTIFICIAL TEARS OPHTHALMIC OINT
TOPICAL_OINTMENT | OPHTHALMIC | Status: AC
  Filled 2023-04-16: qty 3.5

## 2023-04-16 MED ORDER — BUPIVACAINE-EPINEPHRINE (PF) 0.5% -1:200000 IJ SOLN
INTRAMUSCULAR | Status: AC
  Filled 2023-04-16: qty 30

## 2023-04-16 MED ORDER — THROMBIN 5000 UNITS EX SOLR
OROMUCOSAL | Status: DC | PRN
  Administered 2023-04-16: 5 mL via TOPICAL

## 2023-04-16 MED ORDER — CHLORHEXIDINE GLUCONATE 0.12 % MT SOLN
15.0000 mL | Freq: Once | OROMUCOSAL | Status: AC
  Administered 2023-04-16: 15 mL via OROMUCOSAL
  Filled 2023-04-16: qty 15

## 2023-04-16 MED ORDER — EPHEDRINE SULFATE-NACL 50-0.9 MG/10ML-% IV SOSY
PREFILLED_SYRINGE | INTRAVENOUS | Status: DC | PRN
  Administered 2023-04-16 (×3): 5 mg via INTRAVENOUS
  Administered 2023-04-16: 10 mg via INTRAVENOUS

## 2023-04-16 MED ORDER — BENAZEPRIL HCL 20 MG PO TABS
40.0000 mg | ORAL_TABLET | Freq: Every day | ORAL | Status: DC
  Administered 2023-04-16 – 2023-04-17 (×2): 40 mg via ORAL
  Filled 2023-04-16 (×2): qty 2

## 2023-04-16 MED ORDER — LIDOCAINE 2% (20 MG/ML) 5 ML SYRINGE
INTRAMUSCULAR | Status: DC | PRN
  Administered 2023-04-16: 100 mg via INTRAVENOUS

## 2023-04-16 MED ORDER — ROCURONIUM BROMIDE 10 MG/ML (PF) SYRINGE
PREFILLED_SYRINGE | INTRAVENOUS | Status: DC | PRN
  Administered 2023-04-16: 10 mg via INTRAVENOUS
  Administered 2023-04-16: 80 mg via INTRAVENOUS

## 2023-04-16 MED ORDER — BACITRACIN ZINC 500 UNIT/GM EX OINT
TOPICAL_OINTMENT | CUTANEOUS | Status: AC
  Filled 2023-04-16: qty 28.35

## 2023-04-16 MED ORDER — CEFAZOLIN SODIUM-DEXTROSE 2-4 GM/100ML-% IV SOLN
2.0000 g | Freq: Three times a day (TID) | INTRAVENOUS | Status: AC
  Administered 2023-04-16 – 2023-04-17 (×2): 2 g via INTRAVENOUS
  Filled 2023-04-16 (×2): qty 100

## 2023-04-16 MED ORDER — PANTOPRAZOLE SODIUM 40 MG IV SOLR: 40.0000 mg | Freq: Every day | INTRAVENOUS | Status: DC

## 2023-04-16 MED ORDER — DEXAMETHASONE SODIUM PHOSPHATE 10 MG/ML IJ SOLN
INTRAMUSCULAR | Status: AC
  Filled 2023-04-16: qty 1

## 2023-04-16 MED ORDER — DEXMEDETOMIDINE HCL IN NACL 200 MCG/50ML IV SOLN
INTRAVENOUS | Status: DC | PRN
  Administered 2023-04-16 (×3): 8 ug via INTRAVENOUS

## 2023-04-16 MED ORDER — MIDAZOLAM HCL 2 MG/2ML IJ SOLN
INTRAMUSCULAR | Status: AC
  Filled 2023-04-16: qty 2

## 2023-04-16 MED ORDER — FENTANYL CITRATE (PF) 100 MCG/2ML IJ SOLN
25.0000 ug | INTRAMUSCULAR | Status: DC | PRN
  Administered 2023-04-16 (×2): 25 ug via INTRAVENOUS

## 2023-04-16 MED ORDER — ALUM & MAG HYDROXIDE-SIMETH 200-200-20 MG/5ML PO SUSP: 30.0000 mL | Freq: Four times a day (QID) | ORAL | Status: DC | PRN

## 2023-04-16 MED ORDER — DEXAMETHASONE SODIUM PHOSPHATE 4 MG/ML IJ SOLN
4.0000 mg | Freq: Four times a day (QID) | INTRAMUSCULAR | Status: AC
  Administered 2023-04-16 – 2023-04-17 (×2): 4 mg via INTRAVENOUS
  Filled 2023-04-16 (×2): qty 1

## 2023-04-16 MED ORDER — LIDOCAINE 2% (20 MG/ML) 5 ML SYRINGE
INTRAMUSCULAR | Status: AC
  Filled 2023-04-16: qty 5

## 2023-04-16 MED ORDER — DOCUSATE SODIUM 100 MG PO CAPS
100.0000 mg | ORAL_CAPSULE | Freq: Two times a day (BID) | ORAL | Status: DC
  Administered 2023-04-16 – 2023-04-17 (×2): 100 mg via ORAL
  Filled 2023-04-16 (×2): qty 1

## 2023-04-16 MED ORDER — DEXAMETHASONE SODIUM PHOSPHATE 10 MG/ML IJ SOLN
INTRAMUSCULAR | Status: DC | PRN
  Administered 2023-04-16: 10 mg via INTRAVENOUS

## 2023-04-16 MED ORDER — LACTATED RINGERS IV SOLN: INTRAVENOUS | Status: DC

## 2023-04-16 MED ORDER — ACETAMINOPHEN 325 MG PO TABS: 650.0000 mg | ORAL_TABLET | ORAL | Status: DC | PRN

## 2023-04-16 MED ORDER — 0.9 % SODIUM CHLORIDE (POUR BTL) OPTIME
TOPICAL | Status: DC | PRN
  Administered 2023-04-16: 1000 mL

## 2023-04-16 MED ORDER — FENTANYL CITRATE (PF) 250 MCG/5ML IJ SOLN
INTRAMUSCULAR | Status: AC
  Filled 2023-04-16: qty 5

## 2023-04-16 MED ORDER — ONDANSETRON HCL 4 MG/2ML IJ SOLN: 4.0000 mg | Freq: Once | INTRAMUSCULAR | Status: DC | PRN

## 2023-04-16 MED ORDER — PROPRANOLOL HCL 10 MG PO TABS: 10.0000 mg | ORAL_TABLET | Freq: Every day | ORAL | Status: DC | PRN

## 2023-04-16 MED ORDER — PROPOFOL 10 MG/ML IV BOLUS
INTRAVENOUS | Status: AC
  Filled 2023-04-16: qty 20

## 2023-04-16 MED ORDER — DEXMEDETOMIDINE HCL IN NACL 80 MCG/20ML IV SOLN
INTRAVENOUS | Status: AC
  Filled 2023-04-16: qty 20

## 2023-04-16 MED ORDER — PHENOL 1.4 % MT LIQD
1.0000 | OROMUCOSAL | Status: DC | PRN
  Administered 2023-04-17: 1 via OROMUCOSAL
  Filled 2023-04-16: qty 177

## 2023-04-16 MED ORDER — FENTANYL CITRATE (PF) 250 MCG/5ML IJ SOLN
INTRAMUSCULAR | Status: DC | PRN
  Administered 2023-04-16 (×2): 50 ug via INTRAVENOUS
  Administered 2023-04-16: 150 ug via INTRAVENOUS
  Administered 2023-04-16: 50 ug via INTRAVENOUS

## 2023-04-16 MED ORDER — ONDANSETRON HCL 4 MG/2ML IJ SOLN
INTRAMUSCULAR | Status: AC
  Filled 2023-04-16: qty 2

## 2023-04-16 MED ORDER — ONDANSETRON HCL 4 MG/2ML IJ SOLN: 4.0000 mg | Freq: Four times a day (QID) | INTRAMUSCULAR | Status: DC | PRN

## 2023-04-16 MED ORDER — ORAL CARE MOUTH RINSE: 15.0000 mL | Freq: Once | OROMUCOSAL | Status: AC

## 2023-04-16 MED ORDER — MORPHINE SULFATE (PF) 4 MG/ML IV SOLN: 4.0000 mg | INTRAVENOUS | Status: DC | PRN

## 2023-04-16 MED ORDER — PHENYLEPHRINE HCL-NACL 20-0.9 MG/250ML-% IV SOLN
INTRAVENOUS | Status: DC | PRN
  Administered 2023-04-16: 30 ug/min via INTRAVENOUS

## 2023-04-16 MED ORDER — MENTHOL 3 MG MT LOZG
1.0000 | LOZENGE | OROMUCOSAL | Status: DC | PRN
  Administered 2023-04-17: 3 mg via ORAL
  Filled 2023-04-16: qty 9

## 2023-04-16 MED ORDER — SUGAMMADEX SODIUM 200 MG/2ML IV SOLN
INTRAVENOUS | Status: DC | PRN
  Administered 2023-04-16: 200 mg via INTRAVENOUS

## 2023-04-16 MED ORDER — FENTANYL CITRATE (PF) 100 MCG/2ML IJ SOLN
INTRAMUSCULAR | Status: AC
  Filled 2023-04-16: qty 2

## 2023-04-16 MED ORDER — BUPIVACAINE-EPINEPHRINE 0.5% -1:200000 IJ SOLN
INTRAMUSCULAR | Status: DC | PRN
  Administered 2023-04-16: 20 mL

## 2023-04-16 MED ORDER — MIDAZOLAM HCL 2 MG/2ML IJ SOLN
INTRAMUSCULAR | Status: DC | PRN
  Administered 2023-04-16: 2 mg via INTRAVENOUS

## 2023-04-16 MED ORDER — ONDANSETRON HCL 4 MG PO TABS: 4.0000 mg | ORAL_TABLET | Freq: Four times a day (QID) | ORAL | Status: DC | PRN

## 2023-04-16 MED ORDER — PHENYLEPHRINE 80 MCG/ML (10ML) SYRINGE FOR IV PUSH (FOR BLOOD PRESSURE SUPPORT)
PREFILLED_SYRINGE | INTRAVENOUS | Status: DC | PRN
  Administered 2023-04-16: 80 ug via INTRAVENOUS

## 2023-04-16 SURGICAL SUPPLY — 63 items
APL SKNCLS STERI-STRIP NONHPOA (GAUZE/BANDAGES/DRESSINGS) ×1
BAG COUNTER SPONGE SURGICOUNT (BAG) ×2 IMPLANT
BAG SPNG CNTER NS LX DISP (BAG) ×1
BENZOIN TINCTURE PRP APPL 2/3 (GAUZE/BANDAGES/DRESSINGS) ×4 IMPLANT
BIT DRILL NEURO 2X3.1 SFT TUCH (MISCELLANEOUS) ×2 IMPLANT
BLADE SURG 15 STRL LF DISP TIS (BLADE) ×2 IMPLANT
BLADE SURG 15 STRL SS (BLADE) ×2
BLADE ULTRA TIP 2M (BLADE) ×2 IMPLANT
BUR BARREL STRAIGHT FLUTE 4.0 (BURR) ×2 IMPLANT
BUR MATCHSTICK NEURO 3.0 LAGG (BURR) ×2 IMPLANT
CAGE ANTC VISTA S 13X16X8 0D (Cage) IMPLANT
CANISTER SUCT 3000ML PPV (MISCELLANEOUS) ×2 IMPLANT
COVER MAYO STAND STRL (DRAPES) ×2 IMPLANT
DEVICE FUSION VISTA 14X14X9MM (Spacer) IMPLANT
DRAIN JACKSON PRATT 10MM FLAT (MISCELLANEOUS) IMPLANT
DRAIN RELI 100 BL SUC LF ST (DRAIN) ×1
DRAPE LAPAROTOMY 100X72 PEDS (DRAPES) ×2 IMPLANT
DRAPE MICROSCOPE SLANT 54X150 (MISCELLANEOUS) IMPLANT
DRAPE SURG 17X23 STRL (DRAPES) ×4 IMPLANT
DRILL NEURO 2X3.1 SOFT TOUCH (MISCELLANEOUS) ×1
DRSG OPSITE POSTOP 3X4 (GAUZE/BANDAGES/DRESSINGS) ×2 IMPLANT
DRSG OPSITE POSTOP 4X6 (GAUZE/BANDAGES/DRESSINGS) IMPLANT
ELECT REM PT RETURN 9FT ADLT (ELECTROSURGICAL) ×1
ELECTRODE REM PT RTRN 9FT ADLT (ELECTROSURGICAL) ×2 IMPLANT
EVACUATOR SILICONE 100CC (DRAIN) IMPLANT
GAUZE 4X4 16PLY ~~LOC~~+RFID DBL (SPONGE) IMPLANT
GLOVE BIO SURGEON STRL SZ 6.5 (GLOVE) ×2 IMPLANT
GLOVE BIO SURGEON STRL SZ8 (GLOVE) ×2 IMPLANT
GLOVE BIO SURGEON STRL SZ8.5 (GLOVE) ×2 IMPLANT
GLOVE BIOGEL PI IND STRL 6.5 (GLOVE) ×2 IMPLANT
GLOVE EXAM NITRILE XL STR (GLOVE) IMPLANT
GOWN STRL REUS W/ TWL LRG LVL3 (GOWN DISPOSABLE) ×2 IMPLANT
GOWN STRL REUS W/ TWL XL LVL3 (GOWN DISPOSABLE) IMPLANT
GOWN STRL REUS W/TWL LRG LVL3 (GOWN DISPOSABLE) ×1
GOWN STRL REUS W/TWL XL LVL3 (GOWN DISPOSABLE)
HEMOSTAT POWDER KIT SURGIFOAM (HEMOSTASIS) ×2 IMPLANT
KIT BASIN OR (CUSTOM PROCEDURE TRAY) ×2 IMPLANT
KIT TURNOVER KIT B (KITS) ×2 IMPLANT
MARKER SKIN DUAL TIP RULER LAB (MISCELLANEOUS) ×2 IMPLANT
NDL HYPO 22X1.5 SAFETY MO (MISCELLANEOUS) ×2 IMPLANT
NDL SPNL 18GX3.5 QUINCKE PK (NEEDLE) ×2 IMPLANT
NEEDLE HYPO 22X1.5 SAFETY MO (MISCELLANEOUS) ×1
NEEDLE SPNL 18GX3.5 QUINCKE PK (NEEDLE) ×1
NS IRRIG 1000ML POUR BTL (IV SOLUTION) ×2 IMPLANT
PACK LAMINECTOMY NEURO (CUSTOM PROCEDURE TRAY) ×2 IMPLANT
PIN DISTRACTION 14MM (PIN) ×4 IMPLANT
PLATE ANT CERV XTEND 1 LV 14 (Plate) IMPLANT
PLATE ANT CERV XTEND 1 LV 20 (Plate) IMPLANT
PUTTY DBM 5CC CALC GRAN (Putty) IMPLANT
SCREW VAR 4.2 XD SELF DRILL 14 (Screw) IMPLANT
SCREW XTD VAR 4.2 SELF TAP (Screw) IMPLANT
SCREW XTEND SELFTAP VAR 4.6X14 (Screw) IMPLANT
SPIKE FLUID TRANSFER (MISCELLANEOUS) ×2 IMPLANT
SPONGE INTESTINAL PEANUT (DISPOSABLE) ×4 IMPLANT
SPONGE SURGIFOAM ABS GEL SZ50 (HEMOSTASIS) IMPLANT
STRIP CLOSURE SKIN 1/2X4 (GAUZE/BANDAGES/DRESSINGS) ×2 IMPLANT
SUT VIC AB 0 CT1 27 (SUTURE) ×2
SUT VIC AB 0 CT1 27XBRD ANTBC (SUTURE) ×2 IMPLANT
SUT VIC AB 3-0 SH 8-18 (SUTURE) ×2 IMPLANT
TOWEL GREEN STERILE (TOWEL DISPOSABLE) ×2 IMPLANT
TOWEL GREEN STERILE FF (TOWEL DISPOSABLE) ×2 IMPLANT
VISTA 14X14X9MM (Spacer) ×1 IMPLANT
WATER STERILE IRR 1000ML POUR (IV SOLUTION) ×2 IMPLANT

## 2023-04-16 NOTE — H&P (Signed)
Subjective: The patient is a 42 year old white male whom I performed a C3-4 and C4-5 anterior cervicectomy fusion and plating on the patient in 2013.  He has done well until recently when develop neck pain, left greater than right arm pain numbness tingling weakness consistent with a cervical myelopathy.  He was worked up with a cervical MRI which demonstrated significant stenosis at C2-3 and C5-6.  I discussed the various treatment options with him.  He has decided proceed with surgery.  Past Medical History:  Diagnosis Date   Anxiety    Arthritis    Depression    GERD (gastroesophageal reflux disease)    Headache(784.0)    Hx migraines as young adult   Hypertension    takes meds daily   Neuromuscular disorder (HCC)    Neuropathy   Sleep apnea     Past Surgical History:  Procedure Laterality Date   ANTERIOR CERVICAL DECOMP/DISCECTOMY FUSION  05/31/2012   Procedure: ANTERIOR CERVICAL DECOMPRESSION/DISCECTOMY FUSION 2 LEVELS;  Surgeon: Cristi Loron, MD;  Location: [facility];  Service: Neurosurgery;  Laterality: N/A;  Cervical three-four Cervical four-five anterior cervical decompression with fusion interbody prothesis plating and bonegraft   CHOLECYSTECTOMY  07/2022   [facility]   KNEE ARTHROSCOPY Right 2006    Allergies  Allergen Reactions   Bee Venom Anaphylaxis    Social History   Tobacco Use   Smoking status: Every Day    Current packs/day: 2.00    Types: Cigarettes   Smokeless tobacco: Never  Substance Use Topics   Alcohol use: Yes    Comment: Rarely (4x/year)    History reviewed. No pertinent family history. Prior to Admission medications   Medication Sig Start Date End Date Taking? Authorizing Provider  amLODipine (NORVASC) 10 MG tablet Take 10 mg by mouth daily.   Yes [provider]  benazepril (LOTENSIN) 40 MG tablet Take 40 mg by mouth daily.   Yes [provider]  cyanocobalamin 2000 MCG tablet Take 2,000 mcg by mouth daily.    Yes [provider]  EPINEPHrine 0.3 mg/0.3 mL IJ SOAJ injection Inject 0.3 mg into the muscle as needed for anaphylaxis. 11/01/21  Yes [provider]  escitalopram (LEXAPRO) 20 MG tablet Take 20 mg by mouth daily.   Yes [provider]  metoprolol succinate (TOPROL-XL) 25 MG 24 hr tablet Take 25 mg by mouth daily.   Yes [provider]  omeprazole (PRILOSEC) 20 MG capsule Take 20 mg by mouth every morning.    Yes [provider]  propranolol (INDERAL) 10 MG tablet Take 10 mg by mouth daily as needed (Migraines). 05/26/22  Yes [provider]     Review of Systems  Positive ROS: As above  All other systems have been reviewed and were otherwise negative with the exception of those mentioned in the HPI and as above.  Objective: Vital signs in last 24 hours: Temp:  [98.8 F (37.1 C)] 98.8 F (37.1 C) (08/29 1144) Pulse Rate:  [85] 85 (08/29 1144) BP: (146)/(99) 146/99 (08/29 1144) SpO2:  [95 %] 95 % (08/29 1144) Weight:  [117.9 kg] 117.9 kg (08/29 1144) Estimated body mass index is 31.65 kg/m as calculated from the following:   Height as of this encounter: 6\' 4"  (1.93 m).   Weight as of this encounter: 117.9 kg.   General Appearance: Alert Head: Normocephalic, without obvious abnormality, atraumatic Eyes: PERRL, conjunctiva/corneas clear, EOM's intact,    Ears: Normal  Throat: Normal  Neck: The  patient's anterior cervical incision is well-healed.  He has limited cervical range of motion.  Spurling's testing is positive on the left. Back: unremarkable Lungs: Clear to auscultation bilaterally, respirations unlabored Heart: Regular rate and rhythm, no murmur, rub or gallop Abdomen: Soft, non-tender Extremities: Extremities normal, atraumatic, no cyanosis or edema Skin: unremarkable  NEUROLOGIC:   Mental status: alert and oriented,Motor Exam - grossly normal Sensory Exam - grossly normal Reflexes:  Coordination - grossly  normal Gait - grossly normal Balance - grossly normal Cranial Nerves: I: smell Not tested  II: visual acuity  OS: Normal  OD: Normal   II: visual fields Full to confrontation  II: pupils Equal, round, reactive to light  III,VII: ptosis None  III,IV,VI: extraocular muscles  Full ROM  V: mastication Normal  V: facial light touch sensation  Normal  V,VII: corneal reflex  Present  VII: facial muscle function - upper  Normal  VII: facial muscle function - lower Normal  VIII: hearing Not tested  IX: soft palate elevation  Normal  IX,X: gag reflex Present  XI: trapezius strength  5/5  XI: sternocleidomastoid strength 5/5  XI: neck flexion strength  5/5  XII: tongue strength  Normal    Data Review Lab Results  Component Value Date   WBC 12.0 (H) 04/09/2023   HGB 15.5 04/09/2023   HCT 46.2 04/09/2023   MCV 89.2 04/09/2023   PLT 299 04/09/2023   Lab Results  Component Value Date   NA 138 04/09/2023   K 4.3 04/09/2023   CL 103 04/09/2023   CO2 25 04/09/2023   BUN 13 04/09/2023   CREATININE 0.82 04/09/2023   GLUCOSE 94 04/09/2023   No results found for: "INR", "PROTIME"  Assessment/Plan: Cervical spondylosis, cervical stenosis, cervical myelopathy, cervical radiculopathy, cervicalgia: I discussed the situation with the patient.  I reviewed his imaging studies with him and pointed out the abnormalities.  We have discussed the various treatment options including surgery.  I have described the surgical treatment and exploration of his cervical fusion, removal of his old plate and screws, and a C2-3 and C5-6 anterior cervical discectomy fusion and plating to decompress the spinal cord and nerves.  I have shown him surgical models.  I have given him a surgical pamphlet.  We have discussed the risk, benefits, alternatives, expected postop course, and likelihood of achieving our goals.  I have answered all his questions.  He has decided proceed with surgery.   Cristi Loron 04/16/2023 3:09 PM

## 2023-04-16 NOTE — Op Note (Signed)
Brief history: The patient is a 42 year old white male on whom I performed a C3-4 and C4-5 anterior cervicectomy fusion plating many years ago.  The patient developed recurrent neck and left greater right arm pain numbness tingling weakness.  He failed medical management and was worked up with a cervical MRI which demonstrated significant narrowing at C2-3 and C5-6.  I discussed the various treatment options with him.  He has decided proceed with surgery.  Preoperative diagnosis: Cervical spondylosis, cervical myelopathy, cervical radiculopathy, cervicalgia, cervical spinal stenosis  Postoperative diagnosis: The same  Procedure: C2-3 and C5-6 anterior cervical discectomy/decompression; C2-3 and C5-6 interbody arthrodesis with local morcellized autograft bone and Zimmer DBM; insertion of interbody prosthesis at C2-3 and C5-6 (Zimmer peek interbody prosthesis); anterior cervical plating from C2-3 and C5-6 with globus titanium plate (2 plates); exploration of cervical fusion/removal of cervical instrumentation  Surgeon: Dr. Delma Officer  Asst.: Dr. Lisbeth Renshaw  Anesthesia: Gen. endotracheal  Estimated blood loss: 100 cc  Drains: 10 mm flat Jackson-Pratt drain in the prevertebral space  Complications: None  Description of procedure: The patient was brought to the operating room by the anesthesia team. General endotracheal anesthesia was induced. A roll was placed under the patient's shoulders to keep the neck in the neutral position. The patient's anterior cervical region was then prepared with Betadine scrub and Betadine solution. Sterile drapes were applied.  The area to be incised was then injected with Marcaine with epinephrine solution. I then used a scalpel to make a transverse incision in the patient's left anterior neck, incising through his old surgical scar. I used the Metzenbaum scissors to dissected through the scar tissue and to divide the platysmal muscle and then to dissect  medial to the sternocleidomastoid muscle, jugular vein, and carotid artery. I carefully dissected down towards the anterior cervical spine identifying the esophagus and retracting it medially. Then using Kitner swabs to clear soft tissue from the anterior cervical spine, exposing the old cervical plate.  We explored the fusion by unlocking the campus and removing the screws from the old plate.  I removed the old plate from Z6-X0.  We inspected the arthrodesis at C3-4 and C4-5.  It appeared solid.  I then used electrocautery to detach the medial border of the longus colli muscle bilaterally from the C2-3 and C5-6 intervertebral disc spaces. I then inserted the Caspar self-retaining retractor underneath the longus colli muscle bilaterally to provide exposure.  We then incised the intervertebral disc at C2-3. We then performed a partial intervertebral discectomy with a pituitary forceps and the Karlin curettes. I then inserted distraction screws into the vertebral bodies at C2-3. We then distracted the interspace. We then used the high-speed drill to decorticate the vertebral endplates at C2-3, to drill away the remainder of the intervertebral disc, to drill away some posterior spondylosis, and to thin out the posterior longitudinal ligament. I then incised ligament with the arachnoid knife. We then removed the ligament with a Kerrison punches undercutting the vertebral endplates and decompressing the thecal sac. We then performed foraminotomies about the bilateral C3 nerve roots. This completed the decompression at this level.  We then repeated the procedure in analogous fashion at C5-6 decompressing the thecal sac and the bilateral C6 nerve roots.  We now turned our to attention to the interbody fusion. We used the trial spacers to determine the appropriate size for the interbody prosthesis. We then pre-filled prosthesis with a combination of local morcellized autograft bone that we obtained during  decompression as  well as Zimmer DBM. We then inserted the prosthesis into the distracted interspace at C2-3 and C5-6. We then removed the distraction screws. There was a good snug fit of the prosthesis in the interspace.   Having completed the fusion we now turned attention to the anterior spinal instrumentation. We used the high-speed drill to drill away some anterior spondylosis at the disc spaces so that the plate lay down flat. We selected the appropriate length titanium anterior cervical plate. We laid it along the anterior aspect of the vertebral bodies from C2-3 and C5-6. We then drilled 14 mm holes at C2, C3, and C6,  we used the old screw holes at C5. We then secured the plate to the vertebral bodies by placing two 14 mm self-tapping screws self drilling at C2, C3, C5 and C6. We then obtained intraoperative radiograph. The demonstrating good position of the instrumentation. We therefore secured the screws the plate the locking each cam. This completed the instrumentation.  We then obtained hemostasis using bipolar electrocautery. We irrigated the wound out with saline solution. We then removed the retractor. We inspected the esophagus for any damage. There was none apparent.  We placed a 10 mm flat Jackson-Pratt drain in the prevertebral space and tunneled it out through a separate stab wound.  We then reapproximated patient's platysmal muscle with interrupted 3-0 Vicryl suture. We then reapproximated the subcutaneous tissue with interrupted 3-0 Vicryl suture. The skin was reapproximated with Steri-Strips and benzoin. The wound was then covered with bacitracin ointment. A sterile dressing was applied. The drapes were removed. Patient was subsequently extubated by the anesthesia team and transported to the post anesthesia care unit in stable condition. All sponge instrument and needle counts were reportedly correct at the end of this case.

## 2023-04-16 NOTE — Anesthesia Procedure Notes (Signed)
Procedure Name: Intubation Date/Time: 04/16/2023 3:36 PM  Performed by: Eulah Pont, CRNAPre-anesthesia Checklist: Patient identified, Emergency Drugs available, Suction available and Patient being monitored Patient Re-evaluated:Patient Re-evaluated prior to induction Oxygen Delivery Method: Circle System Utilized Preoxygenation: Pre-oxygenation with 100% oxygen Induction Type: IV induction Ventilation: Mask ventilation without difficulty Laryngoscope Size: Glidescope and 3 Grade View: Grade II Tube type: Oral Number of attempts: 1 Airway Equipment and Method: Stylet and Oral airway Placement Confirmation: ETT inserted through vocal cords under direct vision, positive ETCO2 and breath sounds checked- equal and bilateral Tube secured with: Tape Dental Injury: Teeth and Oropharynx as per pre-operative assessment  Comments: Head and neck maintained in neutral position throughout

## 2023-04-16 NOTE — Transfer of Care (Signed)
Immediate Anesthesia Transfer of Care Note  Patient: Edward Perez  Procedure(s) Performed: ANTERIOR CERVICAL DECOMPRESSION/DISCECTOMY [place] Lovely PROSTHESIS,PLATE/SCREWS CERVICAL TWO-THREE, CERVICAL FIVE-SIX;REMOVAL OF GLOBUS PLATE  Patient Location: PACU  Anesthesia Type:General  Level of Consciousness: awake, alert , and oriented  Airway & Oxygen Therapy: Patient Spontanous Breathing and Patient connected to nasal cannula oxygen  Post-op Assessment: Report given to RN and Post -op Vital signs reviewed and stable  Post vital signs: Reviewed and stable  Last Vitals:  Vitals Value Taken Time  BP 134/74 04/16/23 1915  Temp 36.7 C 04/16/23 1915  Pulse 91 04/16/23 1918  Resp 22 04/16/23 1918  SpO2 91 % 04/16/23 1918  Vitals shown include unfiled device data.  Last Pain:  Vitals:   04/16/23 1153  TempSrc:   PainSc: 3       Patients Stated Pain Goal: 0 (04/16/23 1153)  Complications: No notable events documented.

## 2023-04-16 NOTE — Anesthesia Preprocedure Evaluation (Addendum)
Anesthesia Evaluation  Patient identified by MRN, date of birth, ID band Patient awake    Reviewed: Allergy & Precautions, NPO status , Patient's Chart, lab work & pertinent test results, reviewed documented beta blocker date and time   History of Anesthesia Complications Negative for: history of anesthetic complications  Airway Mallampati: II  TM Distance: >3 FB Neck ROM: Limited    Dental  (+) Poor Dentition   Pulmonary sleep apnea and Continuous Positive Airway Pressure Ventilation , neg pneumonia , Current Smoker   breath sounds clear to auscultation       Cardiovascular hypertension, (-) CAD, (-) Past MI, (-) Cardiac Stents and (-) CABG (-) Cardiac Defibrillator  Rhythm:Regular Rate:Normal     Neuro/Psych  Headaches PSYCHIATRIC DISORDERS Anxiety Depression    Pt states numbness, tingling in LUE when he turns head to the left  Neuromuscular disease    GI/Hepatic ,GERD  ,,(+) neg Cirrhosis        Endo/Other  neg diabetes    Renal/GU Renal disease     Musculoskeletal  (+) Arthritis ,    Abdominal   Peds  Hematology   Anesthesia Other Findings   Reproductive/Obstetrics                             Anesthesia Physical Anesthesia Plan  ASA: 3  Anesthesia Plan: General   Post-op Pain Management:    Induction: Intravenous  PONV Risk Score and Plan: 1 and Ondansetron and Dexamethasone  Airway Management Planned: Oral ETT and Video Laryngoscope Planned  Additional Equipment: Arterial line  Intra-op Plan:   Post-operative Plan:   Informed Consent: I have reviewed the patients History and Physical, chart, labs and discussed the procedure including the risks, benefits and alternatives for the proposed anesthesia with the patient or authorized representative who has indicated his/her understanding and acceptance.     Dental advisory given  Plan Discussed with: CRNA  Anesthesia  Plan Comments:        Anesthesia Quick Evaluation

## 2023-04-17 DIAGNOSIS — M4722 Other spondylosis with radiculopathy, cervical region: Secondary | ICD-10-CM | POA: Diagnosis not present

## 2023-04-17 MED ORDER — DOCUSATE SODIUM 100 MG PO CAPS: 100.0000 mg | ORAL_CAPSULE | Freq: Two times a day (BID) | ORAL | 0 refills | Status: AC

## 2023-04-17 MED ORDER — OXYCODONE-ACETAMINOPHEN 5-325 MG PO TABS: 1.0000 | ORAL_TABLET | ORAL | 0 refills | Status: AC | PRN

## 2023-04-17 MED ORDER — CYCLOBENZAPRINE HCL 10 MG PO TABS: 10.0000 mg | ORAL_TABLET | Freq: Three times a day (TID) | ORAL | 0 refills | Status: AC | PRN

## 2023-04-17 NOTE — Anesthesia Postprocedure Evaluation (Signed)
Anesthesia Post Note  Patient: Edward Perez  Procedure(s) Performed: ANTERIOR CERVICAL DECOMPRESSION/DISCECTOMY FUSION,INTERBOODY PROSTHESIS,PLATE/SCREWS CERVICAL TWO-THREE, CERVICAL FIVE-SIX;REMOVAL OF GLOBUS PLATE     Patient location during evaluation: PACU Anesthesia Type: General Level of consciousness: oriented, sedated and patient cooperative Pain management: pain level controlled Vital Signs Assessment: post-procedure vital signs reviewed and stable Respiratory status: spontaneous breathing, nonlabored ventilation, respiratory function stable and patient connected to nasal cannula oxygen Cardiovascular status: blood pressure returned to baseline and stable Postop Assessment: no apparent nausea or vomiting Anesthetic complications: no   No notable events documented.  Last Vitals:  Vitals:   04/16/23 2039 04/16/23 2256  BP: (!) 155/94 (!) 140/79  Pulse: 85 95  Resp: 20 18  Temp: 36.5 C 36.6 C  SpO2: 96% 96%    Last Pain:  Vitals:   04/16/23 2256  TempSrc: Oral  PainSc:                  Fayth Trefry,E. Rainna Nearhood

## 2023-04-17 NOTE — Discharge Instructions (Signed)
Wound Care Keep incision covered and dry until post op day 3. You may remove the Honeycomb dressing on post op day 3. Leave steri-strips on neck  They will fall off by themselves. Do not put any creams, lotions, or ointments on incision. You are fine to shower. Let water run over incision and pat dry.  Activity Walk each and every day, increasing distance each day. No lifting greater than 5 lbs.  Avoid excessive neck motion. No driving for 2 weeks; may ride as a passenger locally.  Diet Resume your normal diet.   Return to Work Will be discussed at your follow up appointment.  Call Your Doctor If Any of These Occur Redness, drainage, or swelling at the wound.  Temperature greater than 101 degrees. Severe pain not relieved by pain medication. Incision starts to come apart.  Follow Up Appt Call [phone] today for appointment in 2-3 weeks if you don't already have one or for any problems.  If you have any hardware placed in your spine, you will need an x-ray before your appointment.

## 2023-04-17 NOTE — Evaluation (Signed)
Occupational Therapy Evaluation and Discharge Patient Details Name: Edward Perez MRN: [id number] DOB: 10-12-1980 Today's Date: 04/17/2023   History of Present Illness Pt is s/p C2-3 and C5-6 anterior cervical discectomy/decompression and fusion due to developing neck pain, left greater than right arm pain numbness tingling weakness consistent with a cervical myelopathy. Cervical MRI which demonstrated significant stenosis at C2-3 and C5-6.   Clinical Impression   This 42 yo male admitted and underwent above presents to acute OT with all education completed. No further OT needs, we will sign off.      If plan is discharge home, recommend the following: Assist for transportation    Functional Status Assessment  Patient has had a recent decline in their functional status and demonstrates the ability to make significant improvements in function in a reasonable and predictable amount of time. (without further OT needs)  Equipment Recommendations  None recommended by OT       Precautions / Restrictions Precautions Precautions: Cervical Required Braces or Orthoses: Cervical Brace Cervical Brace: Hard collar;At all times Restrictions Weight Bearing Restrictions: No      Mobility Bed Mobility Overal bed mobility: Modified Independent                  Transfers Overall transfer level: Independent                 General transfer comment: up and down a flight of steps Mod I (use of rails)      Balance Overall balance assessment: Independent                                         ADL either performed or assessed with clinical judgement   ADL Overall ADL's : Modified independent                                       General ADL Comments: Educated on use of straws when drinking from cups, using 2 cups for mouth care (one spit and one rinse with straw), bringing legs up to him not bending over to feet, wear and care of his  cervical collar     Vision Patient Visual Report: No change from baseline              Pertinent Vitals/Pain Pain Assessment Pain Assessment: 0-10 Pain Score: 3  Pain Location: incisional Pain Descriptors / Indicators: Aching, Sore Pain Intervention(s): Limited activity within patient's tolerance, Monitored during session     Extremity/Trunk Assessment Upper Extremity Assessment Upper Extremity Assessment: Left hand dominant           Communication Communication Communication: No apparent difficulties   Cognition Arousal: Alert Behavior During Therapy: WFL for tasks assessed/performed Overall Cognitive Status: Within Functional Limits for tasks assessed                                                  Home Living Family/patient expects to be discharged to:: Private residence Living Arrangements: Alone Available Help at Discharge: Friend(s);Available 24 hours/day;Family Type of Home: Mobile home Home Access: Stairs to enter   Entrance Stairs-Rails: Left;Right;Can reach both Home Layout: One level  Bathroom Shower/Tub: Chief Strategy Officer: Standard     Home Equipment: Hand held shower head          Prior Functioning/Environment Prior Level of Function : Independent/Modified Independent;Driving                        OT Problem List: Decreased strength;Pain         OT Goals(Current goals can be found in the care plan section) Acute Rehab OT Goals Patient Stated Goal: to go home today         AM-PAC OT "6 Clicks" Daily Activity     Outcome Measure Help from another person eating meals?: None Help from another person taking care of personal grooming?: None Help from another person toileting, which includes using toliet, bedpan, or urinal?: None Help from another person bathing (including washing, rinsing, drying)?: None Help from another person to put on and taking off regular upper body clothing?:  None Help from another person to put on and taking off regular lower body clothing?: None 6 Click Score: 24   End of Session Equipment Utilized During Treatment: Cervical collar Nurse Communication:  (no further OT needs)  Activity Tolerance: Patient tolerated treatment well Patient left: in chair  OT Visit Diagnosis: Pain Pain - part of body:  (incisional)                Time: 1610-9604 OT Time Calculation (min): 21 min Charges:  OT General Charges $OT Visit: 1 Visit OT Evaluation $OT Eval Moderate Complexity: 1 Mod  Cathy L. OT Acute Rehabilitation Services Office [phone]    Evette Georges 04/17/2023, 9:44 AM

## 2023-04-17 NOTE — Progress Notes (Signed)
Patient alert and oriented, mae's well, voiding adequate amount of urine, swallowing without difficulty, no c/o pain at time of discharge. Patient discharged home with family. Script and discharged instructions given to patient. Patient and family stated understanding of instructions given. Patient has an appointment with Dr. Jenkins   

## 2023-04-17 NOTE — Discharge Summary (Signed)
Physician Discharge Summary     Providing Compassionate, Quality Care - Together   Patient ID: Edward Perez MRN: [id number] DOB/AGE: April 14, 1981 42 y.o.  Admit date: 04/16/2023 Discharge date: 04/17/2023  Admission Diagnoses: Cervical spondylosis with myelopathy and radiculopathy  Discharge Diagnoses:  Principal Problem:   Cervical spondylosis with myelopathy and radiculopathy   Discharged Condition: good  Hospital Course: Patient underwent a C2-3 and C5-6 ACDF by Dr. Lovell Sheehan on 04/16/2023. He was admitted to 3C06 following recovery from anesthesia in the PACU. His postoperative course has been uncomplicated. He has worked with occupational therapy who feels the patient is ready for discharge home. He is ambulating independently and without difficulty. He is tolerating a normal diet. He is not having any bowel or bladder dysfunction. His pain is well-controlled with oral pain medication. He is ready for discharge home.   Consults: OT  Significant Diagnostic Studies: radiology: DG Cervical Spine 1 View  Result Date: 04/16/2023 CLINICAL DATA:  ACDF EXAM: DG CERVICAL SPINE - 1 VIEW COMPARISON:  03/04/2023 FINDINGS: Cross-table lateral view of cervical spine was obtained during performance of procedure and is provided for interpretation. Removal of the prior C3-C5 fusion hardware. Interval performance of C2-3 and C5-6 ACDF, with orthopedic hardware in the expected position without evidence of acute complication. Endotracheal tube, enteric catheter, and laparotomy pad overlie the mid neck. IMPRESSION: 1. C2-3 and C5-6 ACDF as above. Removal of the previous fusion hardware spanning C3 through C5. Alignment is anatomic. Electronically Signed   By: Sharlet Salina M.D.   On: 04/16/2023 22:38     Treatments: surgery: C2-3 and C5-6 anterior cervical discectomy/decompression; C2-3 and C5-6 interbody arthrodesis with local morcellized autograft bone and Zimmer DBM; insertion of interbody prosthesis  at C2-3 and C5-6 (Zimmer peek interbody prosthesis); anterior cervical plating from C2-3 and C5-6 with globus titanium plate (2 plates); exploration of cervical fusion/removal of cervical instrumentation   Discharge Exam: Blood pressure 132/86, pulse 88, temperature 97.6 F (36.4 C), temperature source Oral, resp. rate 20, height 6\' 4"  (1.93 m), weight 117.9 kg, SpO2 97%.  Alert and oriented x 4 PERRLA CN II-XII grossly intact MAE, Strength and sensation intact Patient is wearing hard cervical collar Incision is covered with Honeycomb dressing and Steri Strips; Dressing is clean, dry, and intact   Disposition:    Allergies as of 04/17/2023       Reactions   Bee Venom Anaphylaxis        Medication List     TAKE these medications    amLODipine 10 MG tablet Commonly known as: NORVASC Take 10 mg by mouth daily.   benazepril 40 MG tablet Commonly known as: LOTENSIN Take 40 mg by mouth daily.   cyanocobalamin 2000 MCG tablet Take 2,000 mcg by mouth daily.   cyclobenzaprine 10 MG tablet Commonly known as: FLEXERIL Take 1 tablet (10 mg total) by mouth 3 (three) times daily as needed for muscle spasms.   docusate sodium 100 MG capsule Commonly known as: COLACE Take 1 capsule (100 mg total) by mouth 2 (two) times daily.   EPINEPHrine 0.3 mg/0.3 mL Soaj injection Commonly known as: EPI-PEN Inject 0.3 mg into the muscle as needed for anaphylaxis.   escitalopram 20 MG tablet Commonly known as: LEXAPRO Take 20 mg by mouth daily.   metoprolol succinate 25 MG 24 hr tablet Commonly known as: TOPROL-XL Take 25 mg by mouth daily.   omeprazole 20 MG capsule Commonly known as: PRILOSEC Take 20 mg by mouth every morning.  oxyCODONE-acetaminophen 5-325 MG tablet Commonly known as: Percocet Take 1-2 tablets by mouth every 4 (four) hours as needed for severe pain.   propranolol 10 MG tablet Commonly known as: INDERAL Take 10 mg by mouth daily as needed (Migraines).                Discharge Care Instructions  (From admission, onward)           Start     Ordered   04/17/23 0000  If the dressing is still on your incision site when you go home, remove it on the third day after your surgery date. Remove dressing if it begins to fall off, or if it is dirty or damaged before the third day.        04/17/23 1139             Signed: Val Eagle, DNP, AGNP-C Nurse Practitioner  [facility] [address], [place], [place] [address] P: [phone]    F: [phone]  04/17/2023, 11:08 AM

## 2023-04-19 ENCOUNTER — Encounter (HOSPITAL_COMMUNITY): Payer: Self-pay | Admitting: Neurosurgery
